# Patient Record
Sex: Female | Born: 1955
Health system: Southern US, Community
[De-identification: ages and names within clinical notes are randomized; demographics above are authoritative.]

## PROBLEM LIST (undated history)

## (undated) DIAGNOSIS — K5730 Diverticulosis of large intestine without perforation or abscess without bleeding: Secondary | ICD-10-CM

## (undated) DIAGNOSIS — M797 Fibromyalgia: Secondary | ICD-10-CM

## (undated) DIAGNOSIS — I10 Essential (primary) hypertension: Secondary | ICD-10-CM

## (undated) DIAGNOSIS — E785 Hyperlipidemia, unspecified: Secondary | ICD-10-CM

## (undated) DIAGNOSIS — M1990 Unspecified osteoarthritis, unspecified site: Secondary | ICD-10-CM

## (undated) HISTORY — DX: Diverticulosis of large intestine without perforation or abscess without bleeding: K57.30

## (undated) HISTORY — DX: Hyperlipidemia, unspecified: E78.5

## (undated) HISTORY — DX: Unspecified osteoarthritis, unspecified site: M19.90

## (undated) HISTORY — DX: Fibromyalgia: M79.7

## (undated) HISTORY — DX: Essential (primary) hypertension: I10

---

## 2006-11-14 ENCOUNTER — Ambulatory Visit: Payer: Self-pay | Admitting: Family Medicine

## 2006-11-17 ENCOUNTER — Ambulatory Visit: Payer: Self-pay | Admitting: *Deleted

## 2007-04-10 ENCOUNTER — Emergency Department (HOSPITAL_COMMUNITY): Admission: EM | Admit: 2007-04-10 | Discharge: 2007-04-10 | Payer: Self-pay | Admitting: Emergency Medicine

## 2007-04-28 ENCOUNTER — Ambulatory Visit: Payer: Self-pay | Admitting: Family Medicine

## 2007-07-29 ENCOUNTER — Encounter (INDEPENDENT_AMBULATORY_CARE_PROVIDER_SITE_OTHER): Payer: Self-pay | Admitting: *Deleted

## 2007-08-29 ENCOUNTER — Encounter: Payer: Self-pay | Admitting: Internal Medicine

## 2007-08-29 ENCOUNTER — Ambulatory Visit: Payer: Self-pay | Admitting: Internal Medicine

## 2007-08-31 ENCOUNTER — Encounter (INDEPENDENT_AMBULATORY_CARE_PROVIDER_SITE_OTHER): Payer: Self-pay | Admitting: Internal Medicine

## 2007-09-24 ENCOUNTER — Encounter: Admission: RE | Admit: 2007-09-24 | Discharge: 2007-10-21 | Payer: Self-pay | Admitting: Internal Medicine

## 2007-10-20 ENCOUNTER — Ambulatory Visit: Payer: Self-pay | Admitting: Family Medicine

## 2008-01-25 ENCOUNTER — Emergency Department (HOSPITAL_COMMUNITY): Admission: EM | Admit: 2008-01-25 | Discharge: 2008-01-25 | Payer: Self-pay | Admitting: Emergency Medicine

## 2008-04-26 ENCOUNTER — Encounter: Admission: RE | Admit: 2008-04-26 | Discharge: 2008-04-26 | Payer: Self-pay | Admitting: Internal Medicine

## 2010-02-06 ENCOUNTER — Ambulatory Visit: Payer: Self-pay | Admitting: Internal Medicine

## 2010-03-05 ENCOUNTER — Ambulatory Visit: Payer: Self-pay | Admitting: Family Medicine

## 2010-03-26 ENCOUNTER — Ambulatory Visit: Payer: Self-pay | Admitting: Internal Medicine

## 2010-03-26 LAB — CONVERTED CEMR LAB
AST: 18 units/L (ref 0–37)
Albumin: 3.8 g/dL (ref 3.5–5.2)
Alkaline Phosphatase: 99 units/L (ref 39–117)
BUN: 16 mg/dL (ref 6–23)
Basophils Absolute: 0.1 10*3/uL (ref 0.0–0.1)
CO2: 21 meq/L (ref 19–32)
Chloride: 107 meq/L (ref 96–112)
Creatinine, Ser: 0.75 mg/dL (ref 0.40–1.20)
Eosinophils Absolute: 0.2 10*3/uL (ref 0.0–0.7)
Glucose, Bld: 113 mg/dL — ABNORMAL HIGH (ref 70–99)
HDL: 55 mg/dL (ref >39)
Hemoglobin: 13.7 g/dL (ref 12.0–15.0)
LDL Cholesterol: 152 mg/dL — ABNORMAL HIGH (ref 0–99)
MCHC: 33.2 g/dL (ref 30.0–36.0)
MCV: 81.1 fL (ref 78.0–100.0)
Monocytes Absolute: 0.6 10*3/uL (ref 0.1–1.0)
Monocytes Relative: 4 % (ref 3–12)
Neutrophils Relative %: 68 % (ref 43–77)
Platelets: 291 10*3/uL (ref 150–400)
Potassium: 4.4 meq/L (ref 3.5–5.3)
RBC: 5.09 M/uL (ref 3.87–5.11)
Sodium: 141 meq/L (ref 135–145)
TSH: 0.802 microintl units/mL (ref 0.350–4.500)
Total Bilirubin: 0.5 mg/dL (ref 0.3–1.2)
Total CHOL/HDL Ratio: 4.2
Total Protein: 7.3 g/dL (ref 6.0–8.3)
VLDL: 25 mg/dL (ref 0–40)

## 2010-04-25 ENCOUNTER — Ambulatory Visit: Payer: Self-pay | Admitting: Internal Medicine

## 2010-04-26 ENCOUNTER — Ambulatory Visit: Payer: Self-pay | Admitting: Family Medicine

## 2010-04-26 LAB — CONVERTED CEMR LAB: Microalb, Ur: 0.5 mg/dL (ref 0.00–1.89)

## 2010-05-16 ENCOUNTER — Ambulatory Visit (HOSPITAL_COMMUNITY): Admission: RE | Admit: 2010-05-16 | Discharge: 2010-05-16 | Payer: Self-pay | Admitting: Internal Medicine

## 2010-05-31 ENCOUNTER — Encounter: Admission: RE | Admit: 2010-05-31 | Discharge: 2010-07-20 | Payer: Self-pay | Source: Home / Self Care

## 2010-06-15 ENCOUNTER — Encounter: Admission: RE | Admit: 2010-06-15 | Discharge: 2010-06-15 | Payer: Self-pay | Admitting: Family Medicine

## 2011-05-29 ENCOUNTER — Other Ambulatory Visit (HOSPITAL_COMMUNITY): Payer: Self-pay | Admitting: Family Medicine

## 2011-05-29 ENCOUNTER — Other Ambulatory Visit: Payer: Self-pay | Admitting: Family Medicine

## 2011-05-29 DIAGNOSIS — Z1231 Encounter for screening mammogram for malignant neoplasm of breast: Secondary | ICD-10-CM

## 2011-05-29 DIAGNOSIS — N951 Menopausal and female climacteric states: Secondary | ICD-10-CM

## 2011-06-04 ENCOUNTER — Encounter: Payer: Self-pay | Admitting: Gastroenterology

## 2011-06-12 ENCOUNTER — Ambulatory Visit (HOSPITAL_COMMUNITY)
Admission: RE | Admit: 2011-06-12 | Discharge: 2011-06-12 | Disposition: A | Discharge status: Home / Self Care | Payer: Self-pay | Source: Ambulatory Visit | Attending: Family Medicine | Admitting: Family Medicine

## 2011-06-12 DIAGNOSIS — Z1231 Encounter for screening mammogram for malignant neoplasm of breast: Secondary | ICD-10-CM | POA: Insufficient documentation

## 2011-06-12 DIAGNOSIS — N951 Menopausal and female climacteric states: Secondary | ICD-10-CM

## 2011-06-26 ENCOUNTER — Ambulatory Visit (AMBULATORY_SURGERY_CENTER): Payer: Self-pay | Admitting: *Deleted

## 2011-06-26 VITALS — Ht 67.0 in | Wt 263.7 lb

## 2011-06-26 DIAGNOSIS — K921 Melena: Secondary | ICD-10-CM

## 2011-06-26 MED ORDER — PEG 3350-KCL-NABCB-NACL-NASULF 236 G PO SOLR: ORAL | Status: AC

## 2011-07-10 ENCOUNTER — Ambulatory Visit (AMBULATORY_SURGERY_CENTER): Payer: Self-pay | Admitting: Gastroenterology

## 2011-07-10 ENCOUNTER — Encounter: Payer: Self-pay | Admitting: Gastroenterology

## 2011-07-10 VITALS — HR 75 | Temp 97.6°F | Resp 16 | Ht 67.0 in | Wt 260.0 lb

## 2011-07-10 DIAGNOSIS — K5730 Diverticulosis of large intestine without perforation or abscess without bleeding: Secondary | ICD-10-CM

## 2011-07-10 DIAGNOSIS — D126 Benign neoplasm of colon, unspecified: Secondary | ICD-10-CM

## 2011-07-10 DIAGNOSIS — K5010 Crohn's disease of large intestine without complications: Secondary | ICD-10-CM | POA: Insufficient documentation

## 2011-07-10 DIAGNOSIS — K921 Melena: Secondary | ICD-10-CM

## 2011-07-10 HISTORY — DX: Diverticulosis of large intestine without perforation or abscess without bleeding: K57.30

## 2011-07-10 MED ORDER — SODIUM CHLORIDE 0.9 % IV SOLN: 500.0000 mL | INTRAVENOUS | Status: DC

## 2011-07-10 NOTE — Patient Instructions (Signed)
Please refer to your blue and neon green sheets for instructions regarding diet and activity for the rest of today.  You may resume your medications as you would normally take them.   Diverticulosis Diverticulosis is a common condition that develops when small pouches (diverticula) form in the wall of the colon. The risk of diverticulosis increases with age. It happens more often in people who eat a low-fiber diet. Most individuals with diverticulosis have no symptoms. Those individuals with symptoms usually experience belly (abdominal) pain, constipation, or loose stools (diarrhea). HOME CARE INSTRUCTIONS  Increase the amount of fiber in your diet as directed by your caregiver or dietician. This may reduce symptoms of diverticulosis.   Your caregiver may recommend taking a dietary fiber supplement.   Drink at least 6 to 8 glasses of water each day to prevent constipation.   Try not to strain when you have a bowel movement.   Your caregiver may recommend avoiding nuts and seeds to prevent complications, although this is still an uncertain benefit.   Only take over-the-counter or prescription medicines for pain, discomfort, or fever as directed by your caregiver.  FOODS HAVING HIGH FIBER CONTENT INCLUDE:  Fruits. Apple, peach, pear, tangerine, raisins, prunes.   Vegetables. Brussels sprouts, asparagus, broccoli, cabbage, carrot, cauliflower, romaine lettuce, spinach, summer squash, tomato, winter squash, zucchini.   Starchy Vegetables. Baked beans, kidney beans, lima beans, split peas, lentils, potatoes (with skin).   Grains. Whole wheat bread, brown rice, bran flake cereal, plain oatmeal, white rice, shredded wheat, bran muffins.  SEEK IMMEDIATE MEDICAL CARE IF:  You develop increasing pain or severe bloating.   You have an oral temperature, not controlled by medicine.   You develop vomiting or bowel movements that are bloody or black.  Document Released: 07/25/2004 Document  Re-Released: 04/17/2010 [facility] Patient Information 2011 [place], [place].   High-Fiber Diet A high-fiber diet changes your normal diet to include more whole grains, legumes, fruits, and vegetables. Changes in the diet involve replacing refined carbohydrates with unrefined foods. The calorie level of the diet is essentially unchanged. The Dietary Reference Intake (recommended amount) for adult males is 38 grams per day. For adult females, it is 25 grams per day. Pregnant and lactating women should consume 28 grams of fiber per day. Fiber is the intact part of a plant that is not broken down during digestion. Functional fiber is fiber that has been isolated from the plant to provide a beneficial effect in the body. PURPOSE  Increase stool bulk.   Ease and regulate bowel movements.   Lower cholesterol.  INDICATIONS THAT YOU NEED MORE FIBER  Constipation and hemorrhoids.   Uncomplicated diverticulosis (intestine condition) and irritable bowel syndrome.   Weight management.   As a protective measure against hardening of the arteries (atherosclerosis), diabetes, and cancer.  NOTE OF CAUTION If you have a digestive or bowel problem, ask your caregiver for advice before adding high-fiber foods to your diet. Some of the following medical problems are such that a high-fiber diet should not be used without consulting your caregiver. DO NOT USE WITH:  Acute diverticulitis (intestine infection).   Partial small bowel obstructions.   Complicated diverticular disease involving bleeding, rupture (perforation), or abscess (boil, furuncle).   Presence of autonomic neuropathy (nerve damage) or gastric paresis (stomach cannot empty itself).  GUIDELINES FOR INCREASING FIBER IN THE DIET  Start adding fiber to the diet slowly. A gradual increase of about 5 more grams (2 slices of whole-wheat bread, 2 servings of most  fruits or vegetables, or 1 bowl of high-fiber cereal) per day is best. Too rapid an  increase in fiber may result in constipation, flatulence, and bloating.   Drink enough water and fluids to keep your urine clear or pale yellow. Water, juice, or caffeine-free drinks are recommended. Not drinking enough fluid may cause constipation.   Eat a variety of high-fiber foods rather than one type of fiber.   Try to increase your intake of fiber through using high-fiber foods rather than fiber pills or supplements that contain small amounts of fiber.   The goal is to change the types of food eaten. Do not supplement your present diet with high-fiber foods, but replace foods in your present diet.  INCLUDE A VARIETY OF FIBER SOURCES  Replace refined and processed grains with whole grains, canned fruits with fresh fruits, and incorporate other fiber sources. White rice, white breads, and most bakery goods contain little or no fiber.   Brown whole-grain rice, buckwheat oats, and many fruits and vegetables are all good sources of fiber. These include: broccoli, Brussels sprouts, cabbage, cauliflower, beets, sweet potatoes, white potatoes (skin on), carrots, tomatoes, eggplant, squash, berries, fresh fruits, and dried fruits.   Cereals appear to be the richest source of fiber. Cereal fiber is found in whole grains and bran. Bran is the fiber-rich outer coat of cereal grain, which is largely removed in refining. In whole-grain cereals, the bran remains. In breakfast cereals, the largest amount of fiber is found in those with "bran" in their names. The fiber content is sometimes indicated on the label.   You may need to include additional fruits and vegetables each day.   In baking, for 1 cup white flour, you may use the following substitutions:   1 cup whole-wheat flour minus 2 tablespoons.   1/2 cup white flour plus 1/2 cup whole-wheat flour.  References: Dietary Reference Intakes: Recommended Intakes for Individuals. [employer]. Institute of Medicine. Food and Nutrition  Board. Document Released: 10/28/2005 Document Re-Released: 01/22/2010 [facility] Patient Information 2011 [place], [place].   Polyps, Colon  A polyp is extra tissue that grows inside your body. Colon polyps grow in the large intestine. The large intestine, also called the colon, is part of your digestive system. It is a long, hollow tube at the end of your digestive tract where your body makes and stores stool. Most polyps are not dangerous. They are benign. This means they are not cancerous. But over time, some types of polyps can turn into cancer. Polyps that are smaller than a pea are usually not harmful. But larger polyps could someday become or may already be cancerous. To be safe, doctors remove all polyps and test them.  WHO GETS POLYPS? Anyone can get polyps, but certain people are more likely than others. You may have a greater chance of getting polyps if:  You are over 50.   You have had polyps before.   Someone in your family has had polyps.   Someone in your family has had cancer of the large intestine.   Find out if someone in your family has had polyps. You may also be more likely to get polyps if you:   Eat a lot of fatty foods   Smoke   Drink alcohol   Do not exercise  Eat too much  SYMPTOMS Most small polyps do not cause symptoms. People often do not know they have one until their caregiver finds it during a regular checkup or while testing  them for something else. Some people do have symptoms like these:  Bleeding from the anus. You might notice blood on your underwear or on toilet paper after you have had a bowel movement.   Constipation or diarrhea that lasts more than a week.   Blood in the stool. Blood can make stool look black or it can show up as red streaks in the stool.  If you have any of these symptoms, see your caregiver. HOW DOES THE DOCTOR TEST FOR POLYPS? The doctor can use four tests to check for polyps:  Digital rectal exam. The caregiver wears  gloves and checks your rectum (the last part of the large intestine) to see if it feels normal. This test would find polyps only in the rectum. Your caregiver may need to do one of the other tests listed below to find polyps higher up in the intestine.   Barium enema. The caregiver puts a liquid called barium into your rectum before taking x-rays of your large intestine. Barium makes your intestine look white in the pictures. Polyps are dark, so they are easy to see.   Sigmoidoscopy. With this test, the caregiver can see inside your large intestine. A thin flexible tube is placed into your rectum. The device is called a sigmoidoscope, which has a light and a tiny video camera in it. The caregiver uses the sigmoidoscope to look at the last third of your large intestine.   Colonoscopy. This test is like sigmoidoscopy, but the caregiver looks at all of the large intestine. It usually requires sedation. This is the most common method for finding and removing polyps.  TREATMENT  The caregiver will remove the polyp during sigmoidoscopy or colonoscopy. The polyp is then tested for cancer.   If you have had polyps, your caregiver may want you to get tested regularly in the future.  PREVENTION There is not one sure way to prevent polyps. You might be able to lower your risk of getting them if you:  Eat more fruits and vegetables and less fatty food.   Do not smoke.   Avoid alcohol.   Exercise every day.   Lose weight if you are overweight.   Eating more calcium and folate can also lower your risk of getting polyps. Some foods that are rich in calcium are milk, cheese, and broccoli. Some foods that are rich in folate are chickpeas, kidney beans, and spinach.   Aspirin might help prevent polyps. Studies are under way.  Document Released: 07/24/2004 Document Re-Released: 04/17/2010 [facility] Patient Information 2011 [place], [place].

## 2011-07-11 ENCOUNTER — Telehealth: Payer: Self-pay

## 2011-07-11 NOTE — Telephone Encounter (Signed)

## 2011-07-18 ENCOUNTER — Encounter: Payer: Self-pay | Admitting: Gastroenterology

## 2011-08-05 LAB — DIFFERENTIAL
Basophils Absolute: 0.1
Eosinophils Relative: 1
Lymphocytes Relative: 27
Neutro Abs: 12.9 — ABNORMAL HIGH

## 2011-08-05 LAB — I-STAT 8, (EC8 V) (CONVERTED LAB)
BUN: 13
Bicarbonate: 19.2 — ABNORMAL LOW
HCT: 47 — ABNORMAL HIGH
Operator id: 272551
pCO2, Ven: 24.1 — ABNORMAL LOW

## 2011-08-05 LAB — POCT I-STAT CREATININE: Creatinine, Ser: 0.9

## 2011-08-05 LAB — POCT CARDIAC MARKERS
CKMB, poc: <1 — ABNORMAL LOW
Myoglobin, poc: 29.9
Operator id: 272551
Troponin i, poc: <0.05

## 2011-08-05 LAB — CBC
Platelets: 294
RDW: 15.7 — ABNORMAL HIGH

## 2011-08-05 LAB — D-DIMER, QUANTITATIVE: D-Dimer, Quant: 0.23

## 2013-02-28 ENCOUNTER — Observation Stay (HOSPITAL_COMMUNITY)
Admission: EM | Admit: 2013-02-28 | Discharge: 2013-03-02 | Disposition: A | Discharge status: Home / Self Care | Payer: Self-pay | Attending: General Surgery | Admitting: General Surgery

## 2013-02-28 ENCOUNTER — Emergency Department (HOSPITAL_COMMUNITY): Payer: Self-pay

## 2013-02-28 ENCOUNTER — Encounter (HOSPITAL_COMMUNITY): Payer: Self-pay

## 2013-02-28 DIAGNOSIS — K8000 Calculus of gallbladder with acute cholecystitis without obstruction: Principal | ICD-10-CM | POA: Insufficient documentation

## 2013-02-28 DIAGNOSIS — R1011 Right upper quadrant pain: Secondary | ICD-10-CM | POA: Insufficient documentation

## 2013-02-28 DIAGNOSIS — I10 Essential (primary) hypertension: Secondary | ICD-10-CM | POA: Insufficient documentation

## 2013-02-28 DIAGNOSIS — E785 Hyperlipidemia, unspecified: Secondary | ICD-10-CM | POA: Insufficient documentation

## 2013-02-28 DIAGNOSIS — J449 Chronic obstructive pulmonary disease, unspecified: Secondary | ICD-10-CM | POA: Insufficient documentation

## 2013-02-28 DIAGNOSIS — J4489 Other specified chronic obstructive pulmonary disease: Secondary | ICD-10-CM | POA: Insufficient documentation

## 2013-02-28 DIAGNOSIS — K810 Acute cholecystitis: Secondary | ICD-10-CM

## 2013-02-28 LAB — CBC WITH DIFFERENTIAL/PLATELET
Eosinophils Absolute: 0.1 10*3/uL (ref 0.0–0.7)
Eosinophils Relative: 1 % (ref 0–5)
HCT: 40.7 % (ref 36.0–46.0)
Hemoglobin: 13.9 g/dL (ref 12.0–15.0)
Lymphs Abs: 4.6 10*3/uL — ABNORMAL HIGH (ref 0.7–4.0)
MCH: 25.9 pg — ABNORMAL LOW (ref 26.0–34.0)
MCV: 75.9 fL — ABNORMAL LOW (ref 78.0–100.0)
Monocytes Absolute: 1.2 10*3/uL — ABNORMAL HIGH (ref 0.1–1.0)
Monocytes Relative: 5 % (ref 3–12)
Platelets: 339 10*3/uL (ref 150–400)
RBC: 5.36 MIL/uL — ABNORMAL HIGH (ref 3.87–5.11)

## 2013-02-28 LAB — COMPREHENSIVE METABOLIC PANEL
BUN: 16 mg/dL (ref 6–23)
Calcium: 10 mg/dL (ref 8.4–10.5)
Creatinine, Ser: 0.83 mg/dL (ref 0.50–1.10)
GFR calc Af Amer: 89 mL/min — ABNORMAL LOW (ref >90)
Glucose, Bld: 110 mg/dL — ABNORMAL HIGH (ref 70–99)
Total Protein: 8.6 g/dL — ABNORMAL HIGH (ref 6.0–8.3)

## 2013-02-28 LAB — LIPASE, BLOOD: Lipase: 61 U/L — ABNORMAL HIGH (ref 11–59)

## 2013-02-28 MED ORDER — MORPHINE SULFATE 4 MG/ML IJ SOLN
4.0000 mg | INTRAMUSCULAR | Status: DC | PRN
  Administered 2013-03-01 (×3): 4 mg via INTRAVENOUS
  Filled 2013-02-28 (×3): qty 1

## 2013-02-28 MED ORDER — ONDANSETRON HCL 4 MG/2ML IJ SOLN
4.0000 mg | Freq: Once | INTRAMUSCULAR | Status: AC
  Administered 2013-02-28: 4 mg via INTRAVENOUS
  Filled 2013-02-28: qty 2

## 2013-02-28 MED ORDER — MORPHINE SULFATE 4 MG/ML IJ SOLN
4.0000 mg | Freq: Once | INTRAMUSCULAR | Status: AC
  Administered 2013-02-28: 4 mg via INTRAVENOUS
  Filled 2013-02-28: qty 1

## 2013-02-28 NOTE — ED Notes (Signed)
Patient from home via EMS. Ambulatory into ED. Complains severe RUQ pain for "a while." Episodic in nature, occurs in waves. No nausea/vomitting. Standing makes it better.

## 2013-02-28 NOTE — ED Notes (Signed)
Patient transported to Ultrasound 

## 2013-02-28 NOTE — ED Notes (Signed)
Patient upset and crying because an ED provider has not seen her yet. Patient has been here for over 2 hours. Attempted to explain delay to patient. Refuses to provide urine specimen until seen by a ED provider. Informed resident and EDP.

## 2013-03-01 ENCOUNTER — Encounter (HOSPITAL_COMMUNITY): Payer: Self-pay | Admitting: *Deleted

## 2013-03-01 ENCOUNTER — Observation Stay (HOSPITAL_COMMUNITY): Payer: Self-pay | Admitting: Anesthesiology

## 2013-03-01 ENCOUNTER — Encounter (HOSPITAL_COMMUNITY): Payer: Self-pay | Admitting: Anesthesiology

## 2013-03-01 ENCOUNTER — Encounter (HOSPITAL_COMMUNITY)
Admission: EM | Disposition: A | Discharge status: Home / Self Care | Payer: Self-pay | Source: Home / Self Care | Attending: Emergency Medicine

## 2013-03-01 DIAGNOSIS — K8010 Calculus of gallbladder with chronic cholecystitis without obstruction: Secondary | ICD-10-CM

## 2013-03-01 HISTORY — PX: CHOLECYSTECTOMY: SHX55

## 2013-03-01 LAB — URINALYSIS, MICROSCOPIC ONLY
Nitrite: NEGATIVE
Protein, ur: NEGATIVE mg/dL
Specific Gravity, Urine: 1.022 (ref 1.005–1.030)
Urobilinogen, UA: 0.2 mg/dL (ref 0.0–1.0)

## 2013-03-01 LAB — URINALYSIS, DIPSTICK ONLY
Bilirubin Urine: NEGATIVE
Glucose, UA: NEGATIVE mg/dL
Hgb urine dipstick: NEGATIVE
Nitrite: NEGATIVE
Specific Gravity, Urine: 1.022 (ref 1.005–1.030)
pH: 5.5 (ref 5.0–8.0)

## 2013-03-01 LAB — SURGICAL PCR SCREEN: MRSA, PCR: NEGATIVE

## 2013-03-01 LAB — CBC
Hemoglobin: 12.6 g/dL (ref 12.0–15.0)
MCH: 25.9 pg — ABNORMAL LOW (ref 26.0–34.0)
MCHC: 33.3 g/dL (ref 30.0–36.0)

## 2013-03-01 LAB — COMPREHENSIVE METABOLIC PANEL
Albumin: 3.2 g/dL — ABNORMAL LOW (ref 3.5–5.2)
Alkaline Phosphatase: 127 U/L — ABNORMAL HIGH (ref 39–117)
BUN: 17 mg/dL (ref 6–23)
Chloride: 101 mEq/L (ref 96–112)
Glucose, Bld: 132 mg/dL — ABNORMAL HIGH (ref 70–99)
Potassium: 3.8 mEq/L (ref 3.5–5.1)
Total Bilirubin: 0.5 mg/dL (ref 0.3–1.2)

## 2013-03-01 LAB — MAGNESIUM: Magnesium: 2 mg/dL (ref 1.5–2.5)

## 2013-03-01 SURGERY — LAPAROSCOPIC CHOLECYSTECTOMY
Anesthesia: General | Site: Abdomen | Wound class: Clean Contaminated

## 2013-03-01 MED ORDER — PANTOPRAZOLE SODIUM 40 MG IV SOLR
40.0000 mg | Freq: Every day | INTRAVENOUS | Status: DC
  Administered 2013-03-01: 40 mg via INTRAVENOUS
  Filled 2013-03-01 (×3): qty 40

## 2013-03-01 MED ORDER — HYDROCODONE-ACETAMINOPHEN 5-325 MG PO TABS
ORAL_TABLET | ORAL | Status: AC
  Administered 2013-03-01: 2 via ORAL
  Filled 2013-03-01: qty 2

## 2013-03-01 MED ORDER — KCL IN DEXTROSE-NACL 20-5-0.45 MEQ/L-%-% IV SOLN
INTRAVENOUS | Status: DC
  Administered 2013-03-01 – 2013-03-02 (×3): via INTRAVENOUS
  Filled 2013-03-01 (×5): qty 1000

## 2013-03-01 MED ORDER — DIPHENHYDRAMINE HCL 50 MG/ML IJ SOLN: 12.5000 mg | Freq: Four times a day (QID) | INTRAMUSCULAR | Status: DC | PRN

## 2013-03-01 MED ORDER — DEXAMETHASONE SODIUM PHOSPHATE 4 MG/ML IJ SOLN
INTRAMUSCULAR | Status: DC | PRN
  Administered 2013-03-01: 8 mg via INTRAVENOUS

## 2013-03-01 MED ORDER — LISINOPRIL-HYDROCHLOROTHIAZIDE 20-12.5 MG PO TABS: 1.0000 | ORAL_TABLET | Freq: Every day | ORAL | Status: DC

## 2013-03-01 MED ORDER — ONDANSETRON HCL 4 MG/2ML IJ SOLN
INTRAMUSCULAR | Status: DC | PRN
  Administered 2013-03-01: 4 mg via INTRAVENOUS

## 2013-03-01 MED ORDER — OXYCODONE HCL 5 MG PO TABS: 5.0000 mg | ORAL_TABLET | Freq: Once | ORAL | Status: DC | PRN

## 2013-03-01 MED ORDER — BUPIVACAINE-EPINEPHRINE 0.25% -1:200000 IJ SOLN
INTRAMUSCULAR | Status: AC
  Filled 2013-03-01: qty 1

## 2013-03-01 MED ORDER — OXYCODONE HCL 5 MG/5ML PO SOLN: 5.0000 mg | Freq: Once | ORAL | Status: DC | PRN

## 2013-03-01 MED ORDER — ONDANSETRON HCL 4 MG/2ML IJ SOLN: 4.0000 mg | Freq: Four times a day (QID) | INTRAMUSCULAR | Status: DC | PRN

## 2013-03-01 MED ORDER — PROPOFOL 10 MG/ML IV BOLUS
INTRAVENOUS | Status: DC | PRN
  Administered 2013-03-01: 150 mg via INTRAVENOUS
  Administered 2013-03-01: 50 mg via INTRAVENOUS

## 2013-03-01 MED ORDER — ACETAMINOPHEN 650 MG RE SUPP: 650.0000 mg | Freq: Four times a day (QID) | RECTAL | Status: DC | PRN

## 2013-03-01 MED ORDER — HYDROCODONE-ACETAMINOPHEN 5-325 MG PO TABS
1.0000 | ORAL_TABLET | ORAL | Status: DC | PRN
  Administered 2013-03-01 – 2013-03-02 (×3): 2 via ORAL
  Filled 2013-03-01 (×3): qty 2

## 2013-03-01 MED ORDER — HYDROMORPHONE HCL PF 1 MG/ML IJ SOLN: 0.2500 mg | INTRAMUSCULAR | Status: DC | PRN

## 2013-03-01 MED ORDER — SODIUM CHLORIDE 0.9 % IR SOLN
Status: DC | PRN
  Administered 2013-03-01: 1000 mL

## 2013-03-01 MED ORDER — ACETAMINOPHEN 325 MG PO TABS
650.0000 mg | ORAL_TABLET | Freq: Four times a day (QID) | ORAL | Status: DC | PRN
  Administered 2013-03-01: 650 mg via ORAL
  Filled 2013-03-01: qty 2

## 2013-03-01 MED ORDER — FENTANYL CITRATE 0.05 MG/ML IJ SOLN
INTRAMUSCULAR | Status: DC | PRN
  Administered 2013-03-01 (×2): 50 ug via INTRAVENOUS
  Administered 2013-03-01: 100 ug via INTRAVENOUS

## 2013-03-01 MED ORDER — ARTIFICIAL TEARS OP OINT
TOPICAL_OINTMENT | OPHTHALMIC | Status: DC | PRN
  Administered 2013-03-01: 1 via OPHTHALMIC

## 2013-03-01 MED ORDER — MIDAZOLAM HCL 5 MG/5ML IJ SOLN
INTRAMUSCULAR | Status: DC | PRN
  Administered 2013-03-01: 2 mg via INTRAVENOUS

## 2013-03-01 MED ORDER — ROCURONIUM BROMIDE 100 MG/10ML IV SOLN
INTRAVENOUS | Status: DC | PRN
  Administered 2013-03-01: 50 mg via INTRAVENOUS

## 2013-03-01 MED ORDER — HYDROCHLOROTHIAZIDE 12.5 MG PO CAPS
12.5000 mg | ORAL_CAPSULE | Freq: Every day | ORAL | Status: DC
  Administered 2013-03-02: 12.5 mg via ORAL
  Filled 2013-03-01: qty 1

## 2013-03-01 MED ORDER — DIPHENHYDRAMINE HCL 12.5 MG/5ML PO ELIX: 12.5000 mg | ORAL_SOLUTION | Freq: Four times a day (QID) | ORAL | Status: DC | PRN

## 2013-03-01 MED ORDER — CYCLOBENZAPRINE HCL 10 MG PO TABS: 10.0000 mg | ORAL_TABLET | Freq: Three times a day (TID) | ORAL | Status: DC | PRN

## 2013-03-01 MED ORDER — NEOSTIGMINE METHYLSULFATE 1 MG/ML IJ SOLN
INTRAMUSCULAR | Status: DC | PRN
  Administered 2013-03-01: 3 mg via INTRAVENOUS

## 2013-03-01 MED ORDER — LIDOCAINE HCL (CARDIAC) 20 MG/ML IV SOLN
INTRAVENOUS | Status: DC | PRN
  Administered 2013-03-01: 100 mg via INTRAVENOUS

## 2013-03-01 MED ORDER — PROMETHAZINE HCL 25 MG/ML IJ SOLN: 6.2500 mg | INTRAMUSCULAR | Status: DC | PRN

## 2013-03-01 MED ORDER — BUPIVACAINE-EPINEPHRINE 0.25% -1:200000 IJ SOLN
INTRAMUSCULAR | Status: DC | PRN
  Administered 2013-03-01: 17 mL

## 2013-03-01 MED ORDER — AMPICILLIN-SULBACTAM SODIUM 3 (2-1) G IJ SOLR
3.0000 g | Freq: Four times a day (QID) | INTRAMUSCULAR | Status: DC
  Administered 2013-03-01 (×2): 3 g via INTRAVENOUS
  Filled 2013-03-01 (×3): qty 3

## 2013-03-01 MED ORDER — SENNA 8.6 MG PO TABS
1.0000 | ORAL_TABLET | Freq: Two times a day (BID) | ORAL | Status: DC
  Administered 2013-03-01 – 2013-03-02 (×2): 8.6 mg via ORAL
  Filled 2013-03-01 (×3): qty 1

## 2013-03-01 MED ORDER — LISINOPRIL 20 MG PO TABS
20.0000 mg | ORAL_TABLET | Freq: Every day | ORAL | Status: DC
  Administered 2013-03-02: 20 mg via ORAL
  Filled 2013-03-01: qty 1

## 2013-03-01 MED ORDER — SODIUM CHLORIDE 0.9 % IV SOLN
INTRAVENOUS | Status: DC | PRN
  Administered 2013-03-01: 10:00:00

## 2013-03-01 MED ORDER — MORPHINE SULFATE 2 MG/ML IJ SOLN
1.0000 mg | INTRAMUSCULAR | Status: DC | PRN
  Administered 2013-03-01 (×2): 2 mg via INTRAVENOUS
  Filled 2013-03-01 (×2): qty 1

## 2013-03-01 MED ORDER — LACTATED RINGERS IV SOLN
INTRAVENOUS | Status: DC | PRN
  Administered 2013-03-01 (×2): via INTRAVENOUS

## 2013-03-01 MED ORDER — GLYCOPYRROLATE 0.2 MG/ML IJ SOLN
INTRAMUSCULAR | Status: DC | PRN
  Administered 2013-03-01: 0.4 mg via INTRAVENOUS

## 2013-03-01 MED ORDER — DOCUSATE SODIUM 100 MG PO CAPS
100.0000 mg | ORAL_CAPSULE | Freq: Two times a day (BID) | ORAL | Status: DC
  Administered 2013-03-01 – 2013-03-02 (×2): 100 mg via ORAL
  Filled 2013-03-01 (×2): qty 1

## 2013-03-01 SURGICAL SUPPLY — 48 items
APPLIER CLIP 5 13 M/L LIGAMAX5 (MISCELLANEOUS) ×3
APPLIER CLIP ROT 10 11.4 M/L (STAPLE)
BLADE SURG ROTATE 9660 (MISCELLANEOUS) IMPLANT
CANISTER SUCTION 2500CC (MISCELLANEOUS) ×3 IMPLANT
CHLORAPREP W/TINT 26ML (MISCELLANEOUS) ×3 IMPLANT
CLIP APPLIE 5 13 M/L LIGAMAX5 (MISCELLANEOUS) ×2 IMPLANT
CLIP APPLIE ROT 10 11.4 M/L (STAPLE) IMPLANT
CLOTH BEACON ORANGE TIMEOUT ST (SAFETY) ×3 IMPLANT
CLSR STERI-STRIP ANTIMIC 1/2X4 (GAUZE/BANDAGES/DRESSINGS) ×3 IMPLANT
COVER MAYO STAND STRL (DRAPES) ×3 IMPLANT
COVER SURGICAL LIGHT HANDLE (MISCELLANEOUS) ×3 IMPLANT
DECANTER SPIKE VIAL GLASS SM (MISCELLANEOUS) ×3 IMPLANT
DERMABOND ADVANCED (GAUZE/BANDAGES/DRESSINGS) ×1
DERMABOND ADVANCED .7 DNX12 (GAUZE/BANDAGES/DRESSINGS) ×2 IMPLANT
DRAPE C-ARM 42X72 X-RAY (DRAPES) ×3 IMPLANT
DRAPE UTILITY 15X26 W/TAPE STR (DRAPE) ×6 IMPLANT
ELECT REM PT RETURN 9FT ADLT (ELECTROSURGICAL) ×3
ELECTRODE REM PT RTRN 9FT ADLT (ELECTROSURGICAL) ×2 IMPLANT
GLOVE BIOGEL PI IND STRL 6.5 (GLOVE) ×2 IMPLANT
GLOVE BIOGEL PI IND STRL 7.0 (GLOVE) ×2 IMPLANT
GLOVE BIOGEL PI IND STRL 8 (GLOVE) ×2 IMPLANT
GLOVE BIOGEL PI INDICATOR 6.5 (GLOVE) ×1
GLOVE BIOGEL PI INDICATOR 7.0 (GLOVE) ×1
GLOVE BIOGEL PI INDICATOR 8 (GLOVE) ×1
GLOVE ECLIPSE 7.5 STRL STRAW (GLOVE) ×3 IMPLANT
GLOVE SS BIOGEL STRL SZ 6.5 (GLOVE) ×2 IMPLANT
GLOVE SS BIOGEL STRL SZ 7 (GLOVE) ×2 IMPLANT
GLOVE SUPERSENSE BIOGEL SZ 6.5 (GLOVE) ×1
GLOVE SUPERSENSE BIOGEL SZ 7 (GLOVE) ×1
GOWN STRL NON-REIN LRG LVL3 (GOWN DISPOSABLE) ×6 IMPLANT
KIT BASIN OR (CUSTOM PROCEDURE TRAY) ×3 IMPLANT
KIT ROOM TURNOVER OR (KITS) ×3 IMPLANT
NS IRRIG 1000ML POUR BTL (IV SOLUTION) ×3 IMPLANT
PAD ARMBOARD 7.5X6 YLW CONV (MISCELLANEOUS) ×6 IMPLANT
POUCH SPECIMEN RETRIEVAL 10MM (ENDOMECHANICALS) IMPLANT
SCISSORS LAP 5X35 DISP (ENDOMECHANICALS) IMPLANT
SET CHOLANGIOGRAPH 5 50 .035 (SET/KITS/TRAYS/PACK) ×3 IMPLANT
SET IRRIG TUBING LAPAROSCOPIC (IRRIGATION / IRRIGATOR) ×3 IMPLANT
SLEEVE ENDOPATH XCEL 5M (ENDOMECHANICALS) ×6 IMPLANT
SPECIMEN JAR SMALL (MISCELLANEOUS) ×3 IMPLANT
SUT MNCRL AB 4-0 PS2 18 (SUTURE) ×3 IMPLANT
TOWEL OR 17X24 6PK STRL BLUE (TOWEL DISPOSABLE) ×3 IMPLANT
TOWEL OR 17X26 10 PK STRL BLUE (TOWEL DISPOSABLE) ×3 IMPLANT
TRAY LAPAROSCOPIC (CUSTOM PROCEDURE TRAY) ×3 IMPLANT
TROCAR XCEL BLUNT TIP 100MML (ENDOMECHANICALS) ×3 IMPLANT
TROCAR XCEL NON-BLD 11X100MML (ENDOMECHANICALS) IMPLANT
TROCAR XCEL NON-BLD 5MMX100MML (ENDOMECHANICALS) ×3 IMPLANT
WATER STERILE IRR 1000ML POUR (IV SOLUTION) IMPLANT

## 2013-03-01 NOTE — Progress Notes (Addendum)
The patient has been NPO since midnight.  Still has some RUQ tenderness, minimal pain.  For laparoscopic cholecystectomy this morning.  Marta Lamas. Gae Bon, MD, FACS [phone] Surgery  The patient's WBC is still > 20K even though she has been on IV antibiotics.  She had a sonographic Murphy's sign on [place].  May have a gangrenous GB or some other process may be going on.  Will proceed with surgery.  Marta Lamas. Gae Bon, MD, FACS [phone] [facility] Surgery

## 2013-03-01 NOTE — ED Notes (Signed)
Surgery at bedside.

## 2013-03-01 NOTE — Anesthesia Preprocedure Evaluation (Addendum)
Anesthesia Evaluation  Patient identified by MRN, date of birth, ID band Patient awake    Reviewed: Allergy & Precautions, H&P , NPO status , Patient's Chart, lab work & pertinent test results  Airway Mallampati: II TM Distance: >3 FB Neck ROM: Full    Dental  (+) Edentulous Upper and Missing   Pulmonary COPDCurrent Smoker,  + rhonchi         Cardiovascular hypertension, Pt. on medications Rhythm:Regular Rate:Normal     Neuro/Psych    GI/Hepatic negative GI ROS, Neg liver ROS, 03-01-13 US Abdomen Abdominal aorta:  Inadequately visualized.  No aneurysm where seen   IMPRESSION: Technically challenging examination due to limited acoustic windows, bowel gas artifact, and shadowing from gallstones.   Gallstones with a positive sonographic Murphy's sign raises concern for acute cholecystitis.   Hepatic steatosis.   CBD, pancreas, and aorta inadequately visualized.        Endo/Other  Morbid obesity  Renal/GU      Musculoskeletal  (+) Fibromyalgia -  Abdominal (+) + obese,   Peds  Hematology   Anesthesia Other Findings 4 Teeth remain on bottom only  Reproductive/Obstetrics                        Anesthesia Physical Anesthesia Plan  ASA: III  Anesthesia Plan: General   Post-op Pain Management:    Induction: Intravenous  Airway Management Planned: Oral ETT  Additional Equipment:   Intra-op Plan:   Post-operative Plan: Extubation in OR  Informed Consent: I have reviewed the patients History and Physical, chart, labs and discussed the procedure including the risks, benefits and alternatives for the proposed anesthesia with the patient or authorized representative who has indicated his/her understanding and acceptance.     Plan Discussed with: CRNA and Surgeon  Anesthesia Plan Comments:         Anesthesia Quick Evaluation

## 2013-03-01 NOTE — Transfer of Care (Signed)
Immediate Anesthesia Transfer of Care Note  Patient: Mariah Martinez  Procedure(s) Performed: Procedure(s): LAPAROSCOPIC CHOLECYSTECTOMY (N/A)  Patient Location: PACU  Anesthesia Type:General  Level of Consciousness: awake, alert , oriented and patient cooperative  Airway & Oxygen Therapy: Patient Spontanous Breathing and Patient connected to nasal cannula oxygen  Post-op Assessment: Report given to PACU RN and Post -op Vital signs reviewed and stable  Post vital signs: Reviewed and stable  Complications: No apparent anesthesia complications

## 2013-03-01 NOTE — H&P (Addendum)
Mariah Martinez is an 57 y.o. female.   Chief Complaint: abdominal pain HPI:  Pt is s 57 yo F with pain for around 18 hours today that has been constant in the upper abdomen.  She has had attacks of pain that are similar over the past few months.  The other ones have resolved spontaneously with rest.  She does think the severe attacks of pain are related to cream/other fried food.  She typically has nausea with the other attacks as well.    Past Medical History  Diagnosis Date  . Hypertension   . Hyperlipidemia   . Arthritis   . Fibromyalgia     History reviewed. No pertinent past surgical history.  Family History  Problem Relation Age of Onset  . Colon cancer Neg Hx   . Colon polyps Neg Hx   . Stomach cancer Neg Hx    Social History:  reports that she has been smoking Cigarettes.  She has a 16 pack-year smoking history. She has never used smokeless tobacco. She reports that she drinks about 0.6 ounces of alcohol per week. She reports that she does not use illicit drugs.  Allergies: No Known Allergies  MedicationsLong-Term  Prescriptions Show Facility-Administered Medications   aspirin 81 MG tablet  cholecalciferol (VITAMIN D) 1000 UNITS tablet  cyclobenzaprine (FLEXERIL) 10 MG tablet  ibuprofen (ADVIL,MOTRIN) 800 MG tablet  lisinopril-hydrochlorothiazide (PRINZIDE,ZESTORETIC) 20-12.5 MG per tablet  vitamin E 600 UNIT capsule     Results for orders placed during the hospital encounter of 02/28/13 (from the past 48 hour(s))  CBC WITH DIFFERENTIAL     Status: Abnormal   Collection Time    02/28/13  9:29 PM      Result Value Range   WBC 22.6 (*) 4.0 - 10.5 K/uL   RBC 5.36 (*) 3.87 - 5.11 MIL/uL   Hemoglobin 13.9  12.0 - 15.0 g/dL   HCT 16.1  09.6 - 04.5 %   MCV 75.9 (*) 78.0 - 100.0 fL   MCH 25.9 (*) 26.0 - 34.0 pg   MCHC 34.2  30.0 - 36.0 g/dL   RDW 40.9 (*) 81.1 - 91.4 %   Platelets 339  150 - 400 K/uL   Neutrophils Relative 74  43 - 77 %   Neutro Abs 16.6 (*)  1.7 - 7.7 K/uL   Lymphocytes Relative 20  12 - 46 %   Lymphs Abs 4.6 (*) 0.7 - 4.0 K/uL   Monocytes Relative 5  3 - 12 %   Monocytes Absolute 1.2 (*) 0.1 - 1.0 K/uL   Eosinophils Relative 1  0 - 5 %   Eosinophils Absolute 0.1  0.0 - 0.7 K/uL   Basophils Relative 0  0 - 1 %   Basophils Absolute 0.0  0.0 - 0.1 K/uL  COMPREHENSIVE METABOLIC PANEL     Status: Abnormal   Collection Time    02/28/13  9:29 PM      Result Value Range   Sodium 131 (*) 135 - 145 mEq/L   Potassium 4.4  3.5 - 5.1 mEq/L   Comment: HEMOLYSIS AT THIS LEVEL MAY AFFECT RESULT   Chloride 96  96 - 112 mEq/L   CO2 23  19 - 32 mEq/L   Glucose, Bld 110 (*) 70 - 99 mg/dL   BUN 16  6 - 23 mg/dL   Creatinine, Ser 7.82  0.50 - 1.10 mg/dL   Calcium 95.6  8.4 - 21.3 mg/dL   Total Protein 8.6 (*) 6.0 - 8.3  g/dL   Albumin 3.4 (*) 3.5 - 5.2 g/dL   AST 30  0 - 37 U/L   ALT 18  0 - 35 U/L   Alkaline Phosphatase 137 (*) 39 - 117 U/L   Total Bilirubin 0.4  0.3 - 1.2 mg/dL   GFR calc non Af Amer 77 (*) >90 mL/min   GFR calc Af Amer 89 (*) >90 mL/min   Comment:            The eGFR has been calculated     using the CKD EPI equation.     This calculation has not been     validated in all clinical     situations.     eGFR's persistently     <90 mL/min signify     possible Chronic Kidney Disease.  LIPASE, BLOOD     Status: Abnormal   Collection Time    02/28/13  9:29 PM      Result Value Range   Lipase 61 (*) 11 - 59 U/L  URINALYSIS, MICROSCOPIC ONLY     Status: Abnormal   Collection Time    03/01/13 12:46 AM      Result Value Range   Color, Urine YELLOW  YELLOW   APPearance CLEAR  CLEAR   Specific Gravity, Urine 1.022  1.005 - 1.030   pH 5.0  5.0 - 8.0   Glucose, UA NEGATIVE  NEGATIVE mg/dL   Hgb urine dipstick NEGATIVE  NEGATIVE   Bilirubin Urine NEGATIVE  NEGATIVE   Ketones, ur NEGATIVE  NEGATIVE mg/dL   Protein, ur NEGATIVE  NEGATIVE mg/dL   Urobilinogen, UA 0.2  0.0 - 1.0 mg/dL   Nitrite NEGATIVE  NEGATIVE    Leukocytes, UA NEGATIVE  NEGATIVE   Bacteria, UA RARE  RARE   Squamous Epithelial / LPF FEW (*) RARE   Casts GRANULAR CAST (*) NEGATIVE   Comment: HYALINE CASTS   US Abdomen Complete  03/01/2013  *RADIOLOGY REPORT*  Clinical Data:  Right upper quadrant abdominal pain.  COMPLETE ABDOMINAL ULTRASOUND  Comparison:  None.  Findings:  Gallbladder:  Gallstones.  Limited evaluation for wall thickening given the shadowing stones.  No pericholecystic fluid visualized. Positive Murphy's sign per the sonographer.  Common bile duct:  Poorly visualized due to the shadowing gallbladder stones.  The proximal portion measures 5 mm.  Liver:  Diffusely increased in echogenicity.  Focal lesion detection is limited in this setting.  IVC:  Poorly visualized  Pancreas:  Poorly visualized throughout due to bowel gas artifact/limited acoustic windows.  Spleen:  Measures 8 cm.  No focal abnormality.  Right Kidney:  Measures 12.4 cm.  No hydronephrosis or focal abnormality.  Left Kidney:  Measures 11.5 cm.  No hydronephrosis or focal abnormality.  Abdominal aorta:  Inadequately visualized.  No aneurysm where seen  IMPRESSION: Technically challenging examination due to limited acoustic windows, bowel gas artifact, and shadowing from gallstones.  Gallstones with a positive sonographic Murphy's sign raises concern for acute cholecystitis.  Hepatic steatosis.  CBD, pancreas, and aorta inadequately visualized.   Original Report Authenticated By: Jearld Lesch, M.D.     Review of Systems  Constitutional: Negative.   HENT: Negative.   Eyes: Negative.   Respiratory: Negative.   Cardiovascular: Negative.   Gastrointestinal: Positive for nausea.  Genitourinary: Negative.   Musculoskeletal: Negative.   Skin: Negative.   Neurological: Negative.   Endo/Heme/Allergies: Negative.   Psychiatric/Behavioral: Negative.     Blood pressure 107/66, pulse 99, temperature 98.3 F (36.8  C), temperature source Oral, resp. rate 18, SpO2  96.00%. Physical Exam  Constitutional: She is oriented to person, place, and time. She appears well-developed and well-nourished. She appears distressed.  HENT:  Head: Normocephalic and atraumatic.  Eyes: Conjunctivae are normal. Pupils are equal, round, and reactive to light. No scleral icterus.  Neck: Normal range of motion. Neck supple. No thyromegaly present.  Cardiovascular: Normal rate, regular rhythm, normal heart sounds and intact distal pulses.  Exam reveals no gallop and no friction rub.   No murmur heard. Respiratory: Effort normal and breath sounds normal.  GI: Soft. Bowel sounds are normal. She exhibits distension. She exhibits no mass. There is tenderness (RUQ/epigastric). There is no rebound and no guarding.  Musculoskeletal: Normal range of motion. She exhibits no edema and no tenderness.  Lymphadenopathy:    She has no cervical adenopathy.  Neurological: She is alert and oriented to person, place, and time. Coordination normal.  Skin: Skin is dry. No rash noted. She is not diaphoretic. No erythema. No pallor.  Psychiatric: She has a normal mood and affect. Her behavior is normal. Judgment and thought content normal.     Assessment/Plan Acute cholecystitis.  Plan lap chole later today with Dr. Lindie Spruce, time permitting. IVF IV antibiotics NPO  The surgical procedure was described to the patient in detail.  The patient was given Agricultural engineer. .  I discussed the incision type and location, the location of the gallbladder, the anatomy of the bile ducts and arteries, and the typical progression of surgery.  I discussed the possibility of converting to an open operation.  I advised of the risks of bleeding, infection, damage to other structures (such as the bile duct, intestine or liver), bile leak, need for other procedures or surgeries, and post op diarrhea/constipation.  We discussed the risk of blood clot.  We discussed the recovery period and post operative  restrictions.  The patient was advised against taking blood thinners the week before surgery.   HTN - treat with home meds.   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  She is very concerned about her surgery. She has no insurance.  She would like to speak to case management.     Janis Sol 03/01/2013, 2:56 AM

## 2013-03-01 NOTE — Preoperative (Signed)
Beta Blockers   Reason not to administer Beta Blockers:Not Applicable 

## 2013-03-01 NOTE — Anesthesia Postprocedure Evaluation (Signed)
  Anesthesia Post-op Note  Patient: Mariah Martinez  Procedure(s) Performed: Procedure(s): LAPAROSCOPIC CHOLECYSTECTOMY (N/A)  Patient Location: PACU  Anesthesia Type:General  Level of Consciousness: awake and alert   Airway and Oxygen Therapy: Patient Spontanous Breathing  Post-op Pain: mild  Post-op Assessment: Post-op Vital signs reviewed, Patient's Cardiovascular Status Stable, Respiratory Function Stable, Patent Airway, No signs of Nausea or vomiting and Pain level controlled  Post-op Vital Signs: stable  Complications: No apparent anesthesia complications

## 2013-03-01 NOTE — Op Note (Signed)
OPERATIVE REPORT  DATE OF OPERATION: 02/28/2013 - 03/01/2013  PATIENT:  Mariah Martinez  57 y.o. female  PRE-OPERATIVE DIAGNOSIS:  acute cholecysititis  POST-OPERATIVE DIAGNOSIS:  acute cholecysititis  PROCEDURE:  Procedure(s): LAPAROSCOPIC CHOLECYSTECTOMY  SURGEON:  Surgeon(s): Cherylynn Ridges, MD  ASSISTANT: None  ANESTHESIA:   general  EBL: <20 ml  BLOOD ADMINISTERED: none  DRAINS: none   SPECIMEN:  Source of Specimen:  Gallbladder and stones  COUNTS CORRECT:  YES  PROCEDURE DETAILS: The patient was taken to the operating room and placed on the table in the supine position.  After an adequate endotracheal anesthetic was administered, (she/he) was prepped with ChloroPrep, and then draped in the usual manner exposing the entire abdomen laterally, inferiorly and up  to the costal margins.  After a proper timeout was performed including identifying the patient and the procedure to be performed, a supra-umbilical 1.5cm midline incision was made using a #15 blade.  This was taken down to the fascia which was then incised with a #15 blade.  The edges of the fascia were tented up with Kocher clamps as the preperitoneal space was penetrated with a Kelly clamp into the peritoneum.  Once this was done, a pursestring suture of 0 Vicryl was passed around the fascial opening.  This was subsequently used to secure the [employer] cannula which was passed into the peritoneal cavity.  Once the [facility] cannula was in place, carbon dioxide gas was insufflated into the peritoneal cavity up to a maximal intra-abdominal pressure of 15mm Hg.The laparoscope, with attached camera and light source, was passed into the peritoneal cavity to visualize the direct insertion of two right upper quadrant 5mm cannulas, and a sup-xiphoid 5mm cannula.  Once all cannulas were in place, the dissection was begun.  Two ratcheted graspers were attached to the dome and infundibulum of the gallbladder and retracted towards the  anterior abdominal wall and the right upper quadrant.  Using cautery attached to a dissecting forceps, the peritoneum overlaying the triangle of Chalot and the hepatoduodenal triangle was dissected away exposing the cystic duct and the cystic artery.  A clip was placed on the gallbladder side of the cystic duct, then  the distal cystic duct was clipped multiple times then transected.  The gallbladder was then dissected out of the hepatic bed without event.  It was retrieved from the abdomen without event.  Once the gallbladder was removed, the bed was inspected for hemostasis.  Once excellent hemostasis was obtained all gas and fluids were aspirated from above the liver, then the cannulas were removed.  The supra-umbilical incision was closed using the pursestring suture which was in place.  0.25% bupivicaine with epinephrine was injected at all sites.  All 10mm or greater cannula sites were close using a running subcuticular stitch of 4-0 Monocryl.  5.68mm cannula sites were closed with Dermabond only.Steri-Strips and Tagaderm were used to complete the dressings at all sites.  At this point all needle, sponge, and instrument counts were correct.The patient was awakened from anesthesia and taken to the PACU in stable condition  PATIENT DISPOSITION:  PACU - hemodynamically stable.   Cherylynn Ridges 4/21/201411:00 AM

## 2013-03-01 NOTE — ED Provider Notes (Signed)
History     CSN: [id number]  Arrival date & time 02/28/13  2024   First MD Initiated Contact with Patient 02/28/13 2301      Chief Complaint  Patient presents with  . Abdominal Pain    (Consider location/radiation/quality/duration/timing/severity/associated sxs/prior treatment) HPI 57 yo female presents to the ER with complaint of abdominal pain and nausea.  Pt c/o abdominal and back cramping for some time, pt estimates going on for several months, but much worse today.  No fevers, chills, no vomiting or diarrhea.  Pain worse in RUQ with radiation into her back.  Pt feels pain is due to muscle spasms, as pain comes in waves.  No prior w/u for symptoms.  Pt had 2 beers earlier today, smokes less than 1 ppd.  No prior abdominal surgeries.  Past Medical History  Diagnosis Date  . Hypertension   . Hyperlipidemia   . Arthritis   . Fibromyalgia     History reviewed. No pertinent past surgical history.  Family History  Problem Relation Age of Onset  . Colon cancer Neg Hx   . Colon polyps Neg Hx   . Stomach cancer Neg Hx     History  Substance Use Topics  . Smoking status: Current Every Day Smoker -- 0.80 packs/day for 20 years    Types: Cigarettes  . Smokeless tobacco: Never Used  . Alcohol Use: 0.6 oz/week    1 Cans of beer per week    OB History   Grav Para Term Preterm Abortions TAB SAB Ect Mult Living                  Review of Systems  All other systems reviewed and are negative.    Allergies  Review of patient's allergies indicates no known allergies.  Home Medications  No current outpatient prescriptions on file.  BP 108/62  Pulse 67  Temp(Src) 98 F (36.7 C) (Oral)  Resp 20  Ht 5\' 7"  (1.702 m)  Wt 263 lb 0.1 oz (119.3 kg)  BMI 41.18 kg/m2  SpO2 96%  Physical Exam  Nursing note and vitals reviewed. Constitutional: She is oriented to person, place, and time. She appears well-developed and well-nourished.  HENT:  Head: Normocephalic and  atraumatic.  Nose: Nose normal.  Mouth/Throat: Oropharynx is clear and moist.  Eyes: Conjunctivae and EOM are normal. Pupils are equal, round, and reactive to light.  Neck: Normal range of motion. Neck supple. No JVD present. No tracheal deviation present. No thyromegaly present.  Cardiovascular: Normal rate, regular rhythm, normal heart sounds and intact distal pulses.  Exam reveals no gallop and no friction rub.   No murmur heard. Pulmonary/Chest: Effort normal and breath sounds normal. No stridor. No respiratory distress. She has no wheezes. She has no rales. She exhibits no tenderness.  Abdominal: Soft. Bowel sounds are normal. She exhibits no distension and no mass. There is tenderness (TTP over RUQ with +murphy sign). There is no rebound and no guarding.  Musculoskeletal: Normal range of motion. She exhibits no edema and no tenderness.  Lymphadenopathy:    She has no cervical adenopathy.  Neurological: She is alert and oriented to person, place, and time. She exhibits normal muscle tone. Coordination normal.  Skin: Skin is warm and dry. No rash noted. No erythema. No pallor.  Psychiatric: She has a normal mood and affect. Her behavior is normal. Judgment and thought content normal.    ED Course  Procedures (including critical care time)  Labs Reviewed  CBC  WITH DIFFERENTIAL - Abnormal; Notable for the following:    WBC 22.6 (*)    RBC 5.36 (*)    MCV 75.9 (*)    MCH 25.9 (*)    RDW 16.7 (*)    Neutro Abs 16.6 (*)    Lymphs Abs 4.6 (*)    Monocytes Absolute 1.2 (*)    All other components within normal limits  COMPREHENSIVE METABOLIC PANEL - Abnormal; Notable for the following:    Sodium 131 (*)    Glucose, Bld 110 (*)    Total Protein 8.6 (*)    Albumin 3.4 (*)    Alkaline Phosphatase 137 (*)    GFR calc non Af Amer 77 (*)    GFR calc Af Amer 89 (*)    All other components within normal limits  LIPASE, BLOOD - Abnormal; Notable for the following:    Lipase 61 (*)     All other components within normal limits  URINALYSIS, MICROSCOPIC ONLY - Abnormal; Notable for the following:    Squamous Epithelial / LPF FEW (*)    Casts GRANULAR CAST (*)    All other components within normal limits  COMPREHENSIVE METABOLIC PANEL - Abnormal; Notable for the following:    Glucose, Bld 132 (*)    Albumin 3.2 (*)    Alkaline Phosphatase 127 (*)    GFR calc non Af Amer 76 (*)    GFR calc Af Amer 88 (*)    All other components within normal limits  CBC - Abnormal; Notable for the following:    WBC 22.1 (*)    MCV 77.6 (*)    MCH 25.9 (*)    RDW 16.4 (*)    All other components within normal limits  SURGICAL PCR SCREEN  MAGNESIUM  PHOSPHORUS  URINALYSIS, DIPSTICK ONLY  CBC  CBC  CBC   US Abdomen Complete  03/01/2013  *RADIOLOGY REPORT*  Clinical Data:  Right upper quadrant abdominal pain.  COMPLETE ABDOMINAL ULTRASOUND  Comparison:  None.  Findings:  Gallbladder:  Gallstones.  Limited evaluation for wall thickening given the shadowing stones.  No pericholecystic fluid visualized. Positive Murphy's sign per the sonographer.  Common bile duct:  Poorly visualized due to the shadowing gallbladder stones.  The proximal portion measures 5 mm.  Liver:  Diffusely increased in echogenicity.  Focal lesion detection is limited in this setting.  IVC:  Poorly visualized  Pancreas:  Poorly visualized throughout due to bowel gas artifact/limited acoustic windows.  Spleen:  Measures 8 cm.  No focal abnormality.  Right Kidney:  Measures 12.4 cm.  No hydronephrosis or focal abnormality.  Left Kidney:  Measures 11.5 cm.  No hydronephrosis or focal abnormality.  Abdominal aorta:  Inadequately visualized.  No aneurysm where seen  IMPRESSION: Technically challenging examination due to limited acoustic windows, bowel gas artifact, and shadowing from gallstones.  Gallstones with a positive sonographic Murphy's sign raises concern for acute cholecystitis.  Hepatic steatosis.  CBD, pancreas, and  aorta inadequately visualized.   Original Report Authenticated By: Jearld Lesch, M.D.      1. Acute cholecystitis       MDM  57 year old female with acute on chronic right upper quadrant pain with radiation to her back.  She is noted to have elevation of her white blood cell count, as well as slight elevation in her lipase.  Ultrasound shows multiple gallstones, and positive Murphy sign.  No pericholecystic fluid or gallbladder wall thickening.  Given her lab abnormalities, and concern for acute  cholecystitis.  Contacted Dr. Donell Beers, on call for surgery, who will see the patient.       Olivia Mackie, MD 03/01/13 [phone]

## 2013-03-02 ENCOUNTER — Encounter (HOSPITAL_COMMUNITY): Payer: Self-pay | Admitting: General Surgery

## 2013-03-02 ENCOUNTER — Other Ambulatory Visit (INDEPENDENT_AMBULATORY_CARE_PROVIDER_SITE_OTHER): Payer: Self-pay | Admitting: *Deleted

## 2013-03-02 DIAGNOSIS — Z9049 Acquired absence of other specified parts of digestive tract: Secondary | ICD-10-CM

## 2013-03-02 LAB — CBC WITH DIFFERENTIAL/PLATELET
Basophils Absolute: 0 10*3/uL (ref 0.0–0.1)
Basophils Relative: 0 % (ref 0–1)
Eosinophils Absolute: 0 10*3/uL (ref 0.0–0.7)
MCH: 25.4 pg — ABNORMAL LOW (ref 26.0–34.0)
MCHC: 32.9 g/dL (ref 30.0–36.0)
Monocytes Relative: 5 % (ref 3–12)
Neutrophils Relative %: 81 % — ABNORMAL HIGH (ref 43–77)
Platelets: 295 10*3/uL (ref 150–400)
RDW: 16.7 % — ABNORMAL HIGH (ref 11.5–15.5)

## 2013-03-02 MED ORDER — HYDROCODONE-ACETAMINOPHEN 5-325 MG PO TABS: 1.0000 | ORAL_TABLET | Freq: Four times a day (QID) | ORAL | Status: DC | PRN

## 2013-03-02 NOTE — Progress Notes (Signed)
Physician Discharge Summary  Patient ID: Mariah Martinez MRN: [id number] DOB/AGE: 1956/10/11 57 y.o.  Admit date: 02/28/2013 Discharge date: 03/02/2013  Admitting Diagnosis: Acute cholecystitis  Discharge Diagnosis Patient Active Problem List   Diagnosis Date Noted  . Blood in stool 07/10/2011  . 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 Segmental colitis 07/10/2011  . Diverticulosis of colon (without mention of hemorrhage) 07/10/2011    Consultants None  Imaging: US Abdomen Complete  03/01/2013  *RADIOLOGY REPORT*  Clinical Data:  Right upper quadrant abdominal pain.  COMPLETE ABDOMINAL ULTRASOUND  Comparison:  None.  Findings:  Gallbladder:  Gallstones.  Limited evaluation for wall thickening given the shadowing stones.  No pericholecystic fluid visualized. Positive Murphy's sign per the sonographer.  Common bile duct:  Poorly visualized due to the shadowing gallbladder stones.  The proximal portion measures 5 mm.  Liver:  Diffusely increased in echogenicity.  Focal lesion detection is limited in this setting.  IVC:  Poorly visualized  Pancreas:  Poorly visualized throughout due to bowel gas artifact/limited acoustic windows.  Spleen:  Measures 8 cm.  No focal abnormality.  Right Kidney:  Measures 12.4 cm.  No hydronephrosis or focal abnormality.  Left Kidney:  Measures 11.5 cm.  No hydronephrosis or focal abnormality.  Abdominal aorta:  Inadequately visualized.  No aneurysm where seen  IMPRESSION: Technically challenging examination due to limited acoustic windows, bowel gas artifact, and shadowing from gallstones.  Gallstones with a positive sonographic Murphy's sign raises concern for acute cholecystitis.  Hepatic steatosis.  CBD, pancreas, and aorta inadequately visualized.   Original Report Authenticated By: Jearld Lesch, M.D.     Procedures Laparoscopic Cholecystectomy  Hospital Course:  57 yo F with pain for around 18 hours prior to admission that has been constant in the  upper abdomen. She has had attacks of pain that are similar over the past few months. The other ones have resolved spontaneously with rest. She does think the severe attacks of pain are related to cream/other fried food. She typically has nausea with the other attacks as well.   Workup showed acute cholecystitis with leukocytosis.  Patient was admitted and underwent procedure listed above.  Tolerated procedure well and was transferred to the floor.  Diet was advanced as tolerated.  Leukocytosis improved from 22 to 17.6.  We will check another CBC in 1 week to ensure its trending down.  On POD #1, the patient was voiding well, tolerating diet, ambulating well, pain well controlled, vital signs stable, incisions c/d/i and felt stable for discharge home.  Patient will follow up in our office in 2 weeks and knows to call with questions or concerns.  Physical Exam: General:  Alert, NAD, pleasant, comfortable Abd:  Soft, ND, mild tenderness, incisions C/D/I    Medication List    TAKE these medications       aspirin 81 MG tablet  Take 81 mg by mouth daily.     cholecalciferol 1000 UNITS tablet  Commonly known as:  VITAMIN D  Take 1,000 Units by mouth daily.     cyclobenzaprine 10 MG tablet  Commonly known as:  FLEXERIL  Take 10 mg by mouth 3 (three) times daily as needed for muscle spasms.     HYDROcodone-acetaminophen 5-325 MG per tablet  Commonly known as:  NORCO/VICODIN  Take 1-2 tablets by mouth every 6 (six) hours as needed.     ibuprofen 800 MG tablet  Commonly known as:  ADVIL,MOTRIN  Take 800 mg by mouth every 8 (eight)  hours as needed for pain.     lisinopril-hydrochlorothiazide 20-12.5 MG per tablet  Commonly known as:  PRINZIDE,ZESTORETIC  Take 1 tablet by mouth daily.     vitamin E 600 UNIT capsule  Take 600 Units by mouth daily.             Follow-up Information   Follow up with [facility] On 03/16/2013. (APPT AT 11:15AM ON MAY 6TH, PLEASE ARRIVE AT  10:45AM FOR CHECK IN)    Contact information:   [address]   [place] [address] [phone]       Signed: Candiss Norse [facility] [phone]  03/02/2013, 8:35 AM

## 2013-03-02 NOTE — Progress Notes (Signed)
Okay to go home.  Will recheck her CBCD in about one week.  Marta Lamas. Gae Bon, MD, FACS [phone] [facility]

## 2013-03-02 NOTE — Progress Notes (Signed)
Entering CBC w/diff for [employer] [facility]

## 2013-03-02 NOTE — Progress Notes (Signed)
Pt discharged to home

## 2013-03-08 ENCOUNTER — Encounter (HOSPITAL_COMMUNITY): Payer: Self-pay

## 2013-03-08 ENCOUNTER — Emergency Department (INDEPENDENT_AMBULATORY_CARE_PROVIDER_SITE_OTHER)
Admission: EM | Admit: 2013-03-08 | Discharge: 2013-03-08 | Disposition: A | Discharge status: Home / Self Care | Payer: Self-pay | Source: Home / Self Care

## 2013-03-08 DIAGNOSIS — D126 Benign neoplasm of colon, unspecified: Secondary | ICD-10-CM

## 2013-03-08 LAB — CBC
HCT: 38.5 % (ref 36.0–46.0)
Hemoglobin: 12.7 g/dL (ref 12.0–15.0)
MCHC: 33 g/dL (ref 30.0–36.0)
RBC: 4.98 MIL/uL (ref 3.87–5.11)
WBC: 16.4 10*3/uL — ABNORMAL HIGH (ref 4.0–10.5)

## 2013-03-08 MED ORDER — SENNOSIDES-DOCUSATE SODIUM 8.6-50 MG PO TABS: 2.0000 | ORAL_TABLET | Freq: Every day | ORAL | Status: DC

## 2013-03-08 NOTE — ED Provider Notes (Signed)
History     CSN: [id number]  Arrival date & time 03/08/13  3381  57 year old female who comes in for a followup of her WBC count  She was recently diagnosed with acute cholecystitis and had a laparoscopic cholecystectomy on 4/21. Postoperatively she has had mild constipation The sutures remain intact except for mild drainage from one of them She is a followup on 5/6 with Dr. Lindie Spruce. The  Chief Complaint  Patient presents with  . Follow-up    (Consider location/radiation/quality/duration/timing/severity/associated sxs/prior treatment) HPI  Past Medical History  Diagnosis Date  . Hypertension   . Hyperlipidemia   . Arthritis   . Fibromyalgia     Past Surgical History  Procedure Laterality Date  . Cholecystectomy N/A 03/01/2013    Procedure: LAPAROSCOPIC CHOLECYSTECTOMY;  Surgeon: Cherylynn Ridges, MD;  Location: [facility] OR;  Service: General;  Laterality: N/A;    Family History  Problem Relation Age of Onset  . Colon cancer Neg Hx   . Colon polyps Neg Hx   . Stomach cancer Neg Hx     History  Substance Use Topics  . Smoking status: Current Every Day Smoker -- 0.80 packs/day for 20 years    Types: Cigarettes  . Smokeless tobacco: Never Used  . Alcohol Use: 0.6 oz/week    1 Cans of beer per week    OB History   Grav Para Term Preterm Abortions TAB SAB Ect Mult Living                  Review of Systems  Allergies  Review of patient's allergies indicates no known allergies.  Home Medications   Current Outpatient Rx  Name  Route  Sig  Dispense  Refill  . aspirin 81 MG tablet   Oral   Take 81 mg by mouth daily.           . cholecalciferol (VITAMIN D) 1000 UNITS tablet   Oral   Take 1,000 Units by mouth daily.         . cyclobenzaprine (FLEXERIL) 10 MG tablet   Oral   Take 10 mg by mouth 3 (three) times daily as needed for muscle spasms.          Marland Kitchen HYDROcodone-acetaminophen (NORCO/VICODIN) 5-325 MG per tablet   Oral   Take 1-2 tablets by mouth every 6  (six) hours as needed.   40 tablet   0   . ibuprofen (ADVIL,MOTRIN) 800 MG tablet   Oral   Take 800 mg by mouth every 8 (eight) hours as needed for pain.          Marland Kitchen lisinopril-hydrochlorothiazide (PRINZIDE,ZESTORETIC) 20-12.5 MG per tablet   Oral   Take 1 tablet by mouth daily.         . vitamin E 600 UNIT capsule   Oral   Take 600 Units by mouth daily.           BP 136/82  Pulse 78  Temp(Src) 98.2 F (36.8 C) (Oral)  Resp 16  SpO2 100%  Physical Exam Eyes: Conjunctivae are normal. Pupils are equal, round, and reactive to light. No scleral icterus.  Neck: Normal range of motion. Neck supple. No thyromegaly present.  Cardiovascular: Normal rate, regular rhythm, normal heart sounds and intact distal pulses. Exam reveals no gallop and no friction rub.  No murmur heard.  Respiratory: Effort normal and breath sounds normal.  GI: Soft. Bowel sounds are normal. She exhibits distension. She exhibits no mass. There is tenderness (RUQ/epigastric). There  is no rebound and no guarding.  Musculoskeletal: Normal range of motion. She exhibits no edema and no tenderness.  Lymphadenopathy:  She has no cervical adenopathy.  Neurological: She is alert and oriented to person, place, and time. Coordination normal.  Skin: Skin is dry. No rash noted. She is not diaphoretic. No erythema. No pallor.  Psychiatric: She has a normal mood and affect. Her behavior is normal. Judgment and thought content normal  ED Course  Procedures (including critical care time)  Labs Reviewed - No data to display No results found.   No diagnosis found.    MDM  Leukocytosis Acute cholecystitis status post cholecystectomy  Plan Redraw CBC to ensure resolution Will prescribe Colace and senna for constipation No repeat followup needed for this issue unless white count has not resolved         Richarda Overlie, MD 03/08/13 1139

## 2013-03-08 NOTE — ED Notes (Signed)
Hospital follow up Had gall bladder removed

## 2013-03-16 ENCOUNTER — Encounter (INDEPENDENT_AMBULATORY_CARE_PROVIDER_SITE_OTHER): Payer: Self-pay

## 2013-03-30 ENCOUNTER — Ambulatory Visit (INDEPENDENT_AMBULATORY_CARE_PROVIDER_SITE_OTHER): Payer: Self-pay | Admitting: Internal Medicine

## 2013-03-30 ENCOUNTER — Encounter (INDEPENDENT_AMBULATORY_CARE_PROVIDER_SITE_OTHER): Payer: Self-pay | Admitting: Internal Medicine

## 2013-03-30 VITALS — BP 130/84 | HR 85 | Temp 96.4°F | Resp 18 | Ht 67.0 in | Wt 266.2 lb

## 2013-03-30 DIAGNOSIS — K810 Acute cholecystitis: Secondary | ICD-10-CM

## 2013-03-30 MED ORDER — IBUPROFEN 800 MG PO TABS: 800.0000 mg | ORAL_TABLET | Freq: Three times a day (TID) | ORAL | Status: DC | PRN

## 2013-03-30 NOTE — Patient Instructions (Signed)
May resume regular activity without restrictions. Follow up as needed. Call with questions or concerns.  

## 2013-03-30 NOTE — Progress Notes (Signed)
  Subjective: Pt returns to the clinic today after undergoing laparoscopic cholecystectomy on 03/01/13 by Dr. Lindie Spruce.  The patient is tolerating their diet well and is having no severe pain.  Bowel function is good.  No problems with the wounds.  Objective: Vital signs in last 24 hours: Reviewed  PE: Abd: soft, non-tender, +bs, incisions well healed  Lab Results:  No results found for this basename: WBC, HGB, HCT, PLT,  in the last 72 hours BMET No results found for this basename: NA, K, CL, CO2, GLUCOSE, BUN, CREATININE, CALCIUM,  in the last 72 hours PT/INR No results found for this basename: LABPROT, INR,  in the last 72 hours CMP     Component Value Date/Time   NA 137 03/01/2013 0635   K 3.8 03/01/2013 0635   CL 101 03/01/2013 0635   CO2 28 03/01/2013 0635   GLUCOSE 132* 03/01/2013 0635   BUN 17 03/01/2013 0635   CREATININE 0.84 03/01/2013 0635   CALCIUM 9.6 03/01/2013 0635   PROT 7.7 03/01/2013 0635   ALBUMIN 3.2* 03/01/2013 0635   AST 15 03/01/2013 0635   ALT 13 03/01/2013 0635   ALKPHOS 127* 03/01/2013 0635   BILITOT 0.5 03/01/2013 0635   GFRNONAA 76* 03/01/2013 0635   GFRAA 88* 03/01/2013 0635   Lipase     Component Value Date/Time   LIPASE 61* 02/28/2013 2129       Studies/Results: No results found.  Anti-infectives: Anti-infectives   None       Assessment/Plan  1.  S/P Laparoscopic Cholecystectomy: doing well, may resume regular activity without restrictions, Pt will follow up with [place] PRN and knows to call with questions or concerns.     Mariah Martinez 03/30/2013

## 2013-07-19 ENCOUNTER — Ambulatory Visit: Payer: No Typology Code available for payment source | Attending: Family Medicine | Admitting: Internal Medicine

## 2013-07-19 VITALS — BP 116/71 | HR 73 | Temp 98.2°F | Resp 16 | Wt 261.8 lb

## 2013-07-19 DIAGNOSIS — Z860100 Personal history of colon polyps, unspecified: Secondary | ICD-10-CM | POA: Insufficient documentation

## 2013-07-19 DIAGNOSIS — M2550 Pain in unspecified joint: Secondary | ICD-10-CM | POA: Insufficient documentation

## 2013-07-19 DIAGNOSIS — M545 Low back pain, unspecified: Secondary | ICD-10-CM | POA: Insufficient documentation

## 2013-07-19 DIAGNOSIS — M797 Fibromyalgia: Secondary | ICD-10-CM

## 2013-07-19 DIAGNOSIS — IMO0001 Reserved for inherently not codable concepts without codable children: Secondary | ICD-10-CM | POA: Insufficient documentation

## 2013-07-19 DIAGNOSIS — E785 Hyperlipidemia, unspecified: Secondary | ICD-10-CM | POA: Insufficient documentation

## 2013-07-19 DIAGNOSIS — I10 Essential (primary) hypertension: Secondary | ICD-10-CM

## 2013-07-19 LAB — BASIC METABOLIC PANEL
CO2: 24 mEq/L (ref 19–32)
Calcium: 9.8 mg/dL (ref 8.4–10.5)
Potassium: 3.8 mEq/L (ref 3.5–5.3)
Sodium: 139 mEq/L (ref 135–145)

## 2013-07-19 LAB — SEDIMENTATION RATE: Sed Rate: 32 mm/hr — ABNORMAL HIGH (ref 0–22)

## 2013-07-19 MED ORDER — PRAVASTATIN SODIUM 20 MG PO TABS: 20.0000 mg | ORAL_TABLET | Freq: Every day | ORAL | Status: DC

## 2013-07-19 MED ORDER — LISINOPRIL-HYDROCHLOROTHIAZIDE 20-12.5 MG PO TABS: 1.0000 | ORAL_TABLET | Freq: Every day | ORAL | Status: DC

## 2013-07-19 MED ORDER — SIMVASTATIN 20 MG PO TABS: 10.0000 mg | ORAL_TABLET | Freq: Every day | ORAL | Status: DC

## 2013-07-19 MED ORDER — GABAPENTIN 100 MG PO CAPS: 100.0000 mg | ORAL_CAPSULE | Freq: Two times a day (BID) | ORAL | Status: DC

## 2013-07-19 MED ORDER — CYCLOBENZAPRINE HCL 10 MG PO TABS: 10.0000 mg | ORAL_TABLET | Freq: Three times a day (TID) | ORAL | Status: DC | PRN

## 2013-07-19 MED ORDER — MELOXICAM 15 MG PO TABS: 15.0000 mg | ORAL_TABLET | Freq: Every day | ORAL | Status: DC

## 2013-07-19 NOTE — Addendum Note (Signed)
Addended by: Maretta Bees on: 07/19/2013 03:03 PM   Modules accepted: Orders, Medications

## 2013-07-19 NOTE — Progress Notes (Signed)
Patient Demographics  Mariah Martinez, is a 57 y.o. female  HYQ:[id number]  [id number]:[id number]  DOB - [birth year]  Chief Complaint  Patient presents with  . Hypertension        Subjective:   Mariah Martinez today is here for a follow up visit. She claims to have a past medical history of hypertension, insomnia, fibromyalgia. For the past month or so, she claims to have generalized muscle aches, generalized arthralgias. She has problems sleeping. Previously she was on doxepin which helped her with sleep. She has been taking ibuprofen 800 mg 3 times a day for pain. She denies any joint swelling, fever. She claims she gets muscle spasms occasionally, and for the past 1 or 2 weeks this has worsened in frequency.  Patient has No headache, No chest pain, No abdominal pain - No Nausea, No new weakness tingling or numbness, No Cough - SOB.   Objective:    Filed Vitals:   07/19/13 1408  BP: 116/71  Pulse: 73  Temp: 98.2 F (36.8 C)  Resp: 16  Weight: 261 lb 12.8 oz (118.752 kg)  SpO2: 100%     ALLERGIES:  No Known Allergies  PAST MEDICAL HISTORY: Past Medical History  Diagnosis Date  . Hypertension   . Hyperlipidemia   . Arthritis   . Fibromyalgia     MEDICATIONS AT HOME: Prior to Admission medications   Medication Sig Start Date End Date Taking? Authorizing Provider  cyclobenzaprine (FLEXERIL) 10 MG tablet Take 1 tablet (10 mg total) by mouth 3 (three) times daily as needed for muscle spasms. 07/19/13  Yes Shanker Levora Dredge, MD  doxepin (SINEQUAN) 25 MG capsule Take 25 mg by mouth. 1 or 2 pills qhs prn   Yes Historical Provider, MD  pravastatin (PRAVACHOL) 20 MG tablet Take 1 tablet (20 mg total) by mouth daily. 07/19/13  Yes Shanker Levora Dredge, MD  aspirin 81 MG tablet Take 81 mg by mouth daily.      Historical Provider, MD  cholecalciferol (VITAMIN D) 1000 UNITS tablet Take 1,000 Units by mouth daily.    Historical Provider, MD  gabapentin (NEURONTIN) 100 MG capsule Take 1  capsule (100 mg total) by mouth 2 (two) times daily. 07/19/13   Shanker Levora Dredge, MD  lisinopril-hydrochlorothiazide (PRINZIDE,ZESTORETIC) 20-12.5 MG per tablet Take 1 tablet by mouth daily. 07/19/13   Shanker Levora Dredge, MD  meloxicam (MOBIC) 15 MG tablet Take 1 tablet (15 mg total) by mouth daily. 07/19/13   Shanker Levora Dredge, MD  vitamin E 600 UNIT capsule Take 600 Units by mouth daily.    Historical Provider, MD     Exam  General appearance :Awake, alert, not in any distress. Speech Clear. Not toxic Looking HEENT: Atraumatic and Normocephalic, pupils equally reactive to light and accomodation Neck: supple, no JVD. No cervical lymphadenopathy.  Chest:Good air entry bilaterally, no added sounds  CVS: S1 S2 regular, no murmurs.  Abdomen: Bowel sounds present, Non tender and not distended with no gaurding, rigidity or rebound. Extremities: B/L Lower Ext shows no edema, both legs are warm to touch. No joint swelling noted. No joint tenderness noted- except for mild tenderness in the right shoulder area. Range of motion is intact. Neurology: Awake alert, and oriented X 3, CN II-XII intact, Non focal Skin:No Rash Wounds:N/A    Data Review   CBC No results found for this basename: WBC, HGB, HCT, PLT, MCV, MCH, MCHC, RDW, NEUTRABS, LYMPHSABS, MONOABS, EOSABS, BASOSABS, BANDABS, BANDSABD,  in the last 168 hours  Chemistries  No results found for this basename: NA, K, CL, CO2, GLUCOSE, BUN, CREATININE, GFRCGP, CALCIUM, MG, AST, ALT, ALKPHOS, BILITOT,  in the last 168 hours ------------------------------------------------------------------------------------------------------------------ No results found for this basename: HGBA1C,  in the last 72 hours ------------------------------------------------------------------------------------------------------------------ No results found for this basename: CHOL, HDL, LDLCALC, TRIG, CHOLHDL, LDLDIRECT,  in the last 72  hours ------------------------------------------------------------------------------------------------------------------ No results found for this basename: TSH, T4TOTAL, FREET3, T3FREE, THYROIDAB,  in the last 72 hours ------------------------------------------------------------------------------------------------------------------ No results found for this basename: VITAMINB12, FOLATE, FERRITIN, TIBC, IRON, RETICCTPCT,  in the last 72 hours  Coagulation profile  No results found for this basename: INR, PROTIME,  in the last 168 hours    Assessment & Plan   Hypertension - Continue with combination of lisinopril/HCTZ.  Probable fibromyalgia - Resume doxepin for sleep - Start low-dose Neurontin - Stop ibuprofen and change to meloxicam - Claims to have muscle spasms-on HCTZ-will check ESR and chemistry panel today  Dyslipidemia - Not taking Pravachol as she ran out of it-resume - Check LDL prior to next visit  Health Maintenance -Colonoscopy: Done in 2012-polyp found-hyperplastic polyp-next due 2022 -Pap Smear: Claims she recently had it done in [place] in the beginning of this year, due in January of 2015 -Mammogram: Will order   We'll also order lipid panel, TSH and A1c-please check prior at next visit  Follow up in one month  The patient was given clear instructions to go to ER or return to medical center if symptoms don't improve, worsen or new problems develop. The patient verbalized understanding. The patient was told to call to get lab results if they haven't heard anything in the next week.

## 2013-07-19 NOTE — Progress Notes (Signed)
Patient here to establish care History of HTN 

## 2013-08-10 ENCOUNTER — Encounter (INDEPENDENT_AMBULATORY_CARE_PROVIDER_SITE_OTHER): Payer: Self-pay

## 2013-08-11 ENCOUNTER — Ambulatory Visit: Payer: No Typology Code available for payment source | Attending: Internal Medicine

## 2013-08-11 DIAGNOSIS — I10 Essential (primary) hypertension: Secondary | ICD-10-CM

## 2013-08-11 LAB — LIPID PANEL
Cholesterol: 279 mg/dL — ABNORMAL HIGH (ref 0–200)
Total CHOL/HDL Ratio: 6.3 Ratio
Triglycerides: 138 mg/dL (ref <150)

## 2013-08-11 LAB — TSH: TSH: 0.159 u[IU]/mL — ABNORMAL LOW (ref 0.350–4.500)

## 2013-08-16 LAB — HEMOGLOBIN A1C

## 2013-08-18 ENCOUNTER — Ambulatory Visit: Payer: No Typology Code available for payment source

## 2013-08-18 ENCOUNTER — Encounter: Payer: Self-pay | Admitting: Internal Medicine

## 2013-08-18 ENCOUNTER — Ambulatory Visit: Payer: No Typology Code available for payment source | Attending: Internal Medicine | Admitting: Internal Medicine

## 2013-08-18 VITALS — BP 124/90 | HR 111 | Temp 98.0°F | Resp 16 | Ht 67.0 in | Wt 261.0 lb

## 2013-08-18 DIAGNOSIS — R000 Tachycardia, unspecified: Secondary | ICD-10-CM | POA: Insufficient documentation

## 2013-08-18 DIAGNOSIS — E079 Disorder of thyroid, unspecified: Secondary | ICD-10-CM

## 2013-08-18 MED ORDER — LABETALOL HCL 100 MG PO TABS: 100.0000 mg | ORAL_TABLET | Freq: Every day | ORAL | Status: DC

## 2013-08-18 NOTE — Progress Notes (Signed)
Pt is here to follow up on her HTN. Pt reports that she is having spasms in throat.   Pt is here to review her lab results Pt needs a medication review.

## 2013-08-19 NOTE — Patient Instructions (Signed)
Hyperthyroidism  The thyroid is a large gland located in the lower front part of your neck. The thyroid helps control metabolism. Metabolism is how your body uses food. It controls metabolism with the hormone thyroxine. When the thyroid is overactive, it produces too much hormone. When this happens, these following problems may occur:   · Nervousness  · Heat intolerance  · Weight loss (in spite of increase food intake)  · Diarrhea  · Change in hair or skin texture  · Palpitations (heart skipping or having extra beats)  · Tachycardia (rapid heart rate)  · Loss of menstruation (amenorrhea)  · Shaking of the hands  CAUSES  · Grave's Disease (the immune system attacks the thyroid gland). This is the most common cause.  · Inflammation of the thyroid gland.  · Tumor (usually benign) in the thyroid gland or elsewhere.  · Excessive use of thyroid medications (both prescription and 'natural').  · Excessive ingestion of Iodine.  DIAGNOSIS   To prove hyperthyroidism, your caregiver may do blood tests and ultrasound tests. Sometimes the signs are hidden. It may be necessary for your caregiver to watch this illness with blood tests, either before or after diagnosis and treatment.  TREATMENT  Short-term treatment  There are several treatments to control symptoms. Drugs called beta blockers may give some relief. Drugs that decrease hormone production will provide temporary relief in many people. These measures will usually not give permanent relief.  Definitive therapy  There are treatments available which can be discussed between you and your caregiver which will permanently treat the problem. These treatments range from surgery (removal of the thyroid), to the use of radioactive iodine (destroys the thyroid by radiation), to the use of antithyroid drugs (interfere with hormone synthesis). The first two treatments are permanent and usually successful. They most often require hormone replacement therapy for life. This is because  it is impossible to remove or destroy the exact amount of thyroid required to make a person euthyroid (normal).  HOME CARE INSTRUCTIONS   See your caregiver if the problems you are being treated for get worse. Examples of this would be the problems listed above.  SEEK MEDICAL CARE IF:  Your general condition worsens.  MAKE SURE YOU:   · Understand these instructions.  · Will watch your condition.  · Will get help right away if you are not doing well or get worse.  Document Released: 10/28/2005 Document Revised: 01/20/2012 Document Reviewed: 03/11/2007  [employer]® Patient Information ©2014 [employer].

## 2013-08-19 NOTE — Progress Notes (Signed)
Patient ID: Mariah Martinez, female   DOB: 1956/11/07, 57 y.o.   MRN: [id number]   CC:  Regular followup  HPI: Patient is 57 year old female who presents to clinic for followup, needs refills on her medications. She denies recent sicknesses or hospitalizations, she takes medicines as prescribed but reports running out of her blood pressure medicines. She denies chest pain or shortness of breath, no recent episodes of abdominal or urinary concerns, no blood in stool or urine noticed, no specific symptoms on today's visit.  No Known Allergies Past Medical History  Diagnosis Date  . Hypertension   . Hyperlipidemia   . Arthritis   . Fibromyalgia    Current Outpatient Prescriptions on File Prior to Visit  Medication Sig Dispense Refill  . aspirin 81 MG tablet Take 81 mg by mouth daily.        . cholecalciferol (VITAMIN D) 1000 UNITS tablet Take 1,000 Units by mouth daily.      . cyclobenzaprine (FLEXERIL) 10 MG tablet Take 1 tablet (10 mg total) by mouth 3 (three) times daily as needed for muscle spasms.  60 tablet  0  . doxepin (SINEQUAN) 25 MG capsule Take 25 mg by mouth. 1 or 2 pills qhs prn      . lisinopril-hydrochlorothiazide (PRINZIDE,ZESTORETIC) 20-12.5 MG per tablet Take 1 tablet by mouth daily.  30 tablet  3  . meloxicam (MOBIC) 15 MG tablet Take 1 tablet (15 mg total) by mouth daily.  30 tablet  0  . simvastatin (ZOCOR) 20 MG tablet Take 0.5 tablets (10 mg total) by mouth at bedtime.  90 tablet  3  . gabapentin (NEURONTIN) 100 MG capsule Take 1 capsule (100 mg total) by mouth 2 (two) times daily.  60 capsule  3  . vitamin E 600 UNIT capsule Take 600 Units by mouth daily.       No current facility-administered medications on file prior to visit.   Family History  Problem Relation Age of Onset  . Colon cancer Neg Hx   . Colon polyps Neg Hx   . Stomach cancer Neg Hx    History   Social History  . Marital Status: Single    Spouse Name: N/A    Number of Children: N/A  .  Years of Education: N/A   Occupational History  . Not on file.   Social History Main Topics  . Smoking status: Current Every Day Smoker -- 0.50 packs/day for 20 years    Types: Cigarettes  . Smokeless tobacco: Never Used  . Alcohol Use: 0.6 oz/week    1 Cans of beer per week     Comment: Monthly.  . Drug Use: No  . Sexual Activity: Not on file   Other Topics Concern  . Not on file   Social History Narrative  . No narrative on file    Review of Systems  Constitutional: Negative for fever, chills, diaphoresis, activity change, appetite change and fatigue.  HENT: Negative for ear pain, nosebleeds, congestion, facial swelling, rhinorrhea, neck pain, neck stiffness and ear discharge.   Eyes: Negative for pain, discharge, redness, itching and visual disturbance.  Respiratory: Negative for cough, choking, chest tightness, shortness of breath, wheezing and stridor.   Cardiovascular: Negative for chest pain, palpitations and leg swelling.  Gastrointestinal: Negative for abdominal distention.  Genitourinary: Negative for dysuria, urgency, frequency, hematuria, flank pain, decreased urine volume, difficulty urinating and dyspareunia.  Musculoskeletal: Negative for back pain, joint swelling, arthralgias and gait problem.  Neurological: Negative for dizziness,  tremors, seizures, syncope, facial asymmetry, speech difficulty, weakness, light-headedness, numbness and headaches.  Hematological: Negative for adenopathy. Does not bruise/bleed easily.  Psychiatric/Behavioral: Negative for hallucinations, behavioral problems, confusion, dysphoric mood, decreased concentration and agitation.    Objective:   Filed Vitals:   08/18/13 1446  BP: 124/90  Pulse: 111  Temp: 98 F (36.7 C)  Resp: 16    Physical Exam  Constitutional: Appears well-developed and well-nourished. No distress.  HENT: Normocephalic. External right and left ear normal. Oropharynx is clear and moist.  Eyes: Conjunctivae  and EOM are normal. PERRLA, no scleral icterus.  Neck: Normal ROM. Neck supple. No JVD. No tracheal deviation. No thyromegaly.  CVS: Regular rhythm, tachycardia, S1/S2 +, no murmurs, no gallops, no carotid bruit.  Pulmonary: Effort and breath sounds normal, no stridor, rhonchi, wheezes, rales.  Abdominal: Soft. BS +,  no distension, tenderness, rebound or guarding.  Musculoskeletal: Normal range of motion. No edema and no tenderness.  Lymphadenopathy: No lymphadenopathy noted, cervical, inguinal. Neuro: Alert. Normal reflexes, muscle tone coordination. No cranial nerve deficit. Skin: Skin is warm and dry. No rash noted. Not diaphoretic. No erythema. No pallor.  Psychiatric: Normal mood and affect. Behavior, judgment, thought content normal.   Lab Results  Component Value Date   WBC 16.4* 03/08/2013   HGB 12.7 03/08/2013   HCT 38.5 03/08/2013   MCV 77.3* 03/08/2013   PLT 336 03/08/2013   Lab Results  Component Value Date   CREATININE 0.90 07/19/2013   BUN 17 07/19/2013   NA 139 07/19/2013   K 3.8 07/19/2013   CL 107 07/19/2013   CO2 24 07/19/2013    Lab Results  Component Value Date   HGBA1C TEST NOT PERFORMED 08/11/2013   Lipid Panel     Component Value Date/Time   CHOL 279* 08/11/2013 1425   TRIG 138 08/11/2013 1425   HDL 44 08/11/2013 1425   CHOLHDL 6.3 08/11/2013 1425   VLDL 28 08/11/2013 1425   LDLCALC 207* 08/11/2013 1425       Assessment and plan:   Patient Active Problem List   Diagnosis Date Noted  .  thyroid dysfunction - unclear etiology, review of records indicate low TSH, will repeat today and followup. I will provide referral to endocrinologist. His TSH is persistently low will have to have patient come back and check T3 and T4.  07/10/2011   Tachycardia - unclear etiology but could be related to low TSH, hyperthyroidism. We'll prescribe low-dose labetalol to take once daily. Patient made aware of adding new medicine to the regimen. She has verbalized understanding

## 2013-09-13 ENCOUNTER — Telehealth: Payer: Self-pay | Admitting: Emergency Medicine

## 2013-09-13 NOTE — Telephone Encounter (Signed)
Pt requesting script Ibuprofen 800 mg tab. States you forgot to order

## 2013-09-14 ENCOUNTER — Telehealth: Payer: Self-pay | Admitting: Emergency Medicine

## 2013-09-14 DIAGNOSIS — D72829 Elevated white blood cell count, unspecified: Secondary | ICD-10-CM

## 2013-09-14 NOTE — Progress Notes (Signed)
Scheduled pt for lab visit 09/17/13

## 2013-09-14 NOTE — Addendum Note (Signed)
Addended by: Maretta Bees on: 09/14/2013 09:23 AM   Modules accepted: Orders

## 2013-09-16 ENCOUNTER — Ambulatory Visit: Payer: No Typology Code available for payment source | Attending: Internal Medicine

## 2013-09-16 DIAGNOSIS — I10 Essential (primary) hypertension: Secondary | ICD-10-CM

## 2013-09-16 LAB — T4, FREE: Free T4: 1 ng/dL (ref 0.80–1.80)

## 2013-09-27 ENCOUNTER — Ambulatory Visit: Payer: Self-pay

## 2013-10-27 ENCOUNTER — Ambulatory Visit: Payer: No Typology Code available for payment source | Attending: Internal Medicine | Admitting: Internal Medicine

## 2013-10-27 VITALS — BP 153/104 | HR 74 | Temp 98.7°F | Resp 14 | Ht 68.0 in | Wt 267.0 lb

## 2013-10-27 DIAGNOSIS — M549 Dorsalgia, unspecified: Secondary | ICD-10-CM

## 2013-10-27 DIAGNOSIS — I10 Essential (primary) hypertension: Secondary | ICD-10-CM | POA: Insufficient documentation

## 2013-10-27 LAB — CBC WITH DIFFERENTIAL/PLATELET
Basophils Absolute: 0 10*3/uL (ref 0.0–0.1)
Basophils Relative: 0 % (ref 0–1)
Eosinophils Absolute: 0.2 10*3/uL (ref 0.0–0.7)
MCH: 26.1 pg (ref 26.0–34.0)
MCHC: 33 g/dL (ref 30.0–36.0)
Neutrophils Relative %: 68 % (ref 43–77)
Platelets: 351 10*3/uL (ref 150–400)
RDW: 17.4 % — ABNORMAL HIGH (ref 11.5–15.5)

## 2013-10-27 LAB — COMPLETE METABOLIC PANEL WITH GFR
ALT: 15 U/L (ref 0–35)
CO2: 24 mEq/L (ref 19–32)
Calcium: 9.6 mg/dL (ref 8.4–10.5)
Chloride: 104 mEq/L (ref 96–112)
GFR, Est African American: >89 mL/min
Glucose, Bld: 105 mg/dL — ABNORMAL HIGH (ref 70–99)
Sodium: 139 mEq/L (ref 135–145)
Total Protein: 7.2 g/dL (ref 6.0–8.3)

## 2013-10-27 MED ORDER — MELOXICAM 15 MG PO TABS: 15.0000 mg | ORAL_TABLET | Freq: Every day | ORAL | Status: DC

## 2013-10-27 MED ORDER — CYCLOBENZAPRINE HCL 5 MG PO TABS: 5.0000 mg | ORAL_TABLET | Freq: Two times a day (BID) | ORAL | Status: DC | PRN

## 2013-10-27 MED ORDER — IBUPROFEN 600 MG PO TABS: 600.0000 mg | ORAL_TABLET | Freq: Three times a day (TID) | ORAL | Status: DC | PRN

## 2013-10-27 MED ORDER — CYCLOBENZAPRINE HCL 10 MG PO TABS: 10.0000 mg | ORAL_TABLET | Freq: Three times a day (TID) | ORAL | Status: DC | PRN

## 2013-10-27 MED ORDER — HYDROCHLOROTHIAZIDE 12.5 MG PO CAPS: 12.5000 mg | ORAL_CAPSULE | Freq: Every day | ORAL | Status: DC

## 2013-10-27 MED ORDER — LOSARTAN POTASSIUM 50 MG PO TABS: 50.0000 mg | ORAL_TABLET | Freq: Every day | ORAL | Status: DC

## 2013-10-27 NOTE — Addendum Note (Signed)
Addended by: Susie Cassette MD, Germain Osgood on: 10/27/2013 09:56 AM   Modules accepted: Orders, Medications

## 2013-10-27 NOTE — Progress Notes (Signed)
Patient ID: Mariah Martinez, female   DOB: June 11, 1956, 57 y.o.   MRN: [id number]   CC:  HPI: This  57 year old female, morbidly obese with a BMI of 40, hypertension presents with 2 complaints. She complains of left sided thoracic paraspinal muscle pain. This has been present for about a month it is without radiation. She does not remember how the pain started. No history of trauma. No numbness and tingling in her legs, no stool or urinary incontinence. She has tried over-the-counter ibuprofen for this  She also complains of a cough due to lisinopril for the last couple of months   No Known Allergies Past Medical History  Diagnosis Date  . Hypertension   . Hyperlipidemia   . Arthritis   . Fibromyalgia    Current Outpatient Prescriptions on File Prior to Visit  Medication Sig Dispense Refill  . aspirin 81 MG tablet Take 81 mg by mouth daily.        . cholecalciferol (VITAMIN D) 1000 UNITS tablet Take 1,000 Units by mouth daily.      Marland Kitchen doxepin (SINEQUAN) 25 MG capsule Take 25 mg by mouth. 1 or 2 pills qhs prn      . gabapentin (NEURONTIN) 100 MG capsule Take 1 capsule (100 mg total) by mouth 2 (two) times daily.  60 capsule  3  . labetalol (NORMODYNE) 100 MG tablet Take 1 tablet (100 mg total) by mouth daily.  30 tablet  1  . simvastatin (ZOCOR) 20 MG tablet Take 0.5 tablets (10 mg total) by mouth at bedtime.  90 tablet  3  . vitamin E 600 UNIT capsule Take 600 Units by mouth daily.       No current facility-administered medications on file prior to visit.   Family History  Problem Relation Age of Onset  . Colon cancer Neg Hx   . Colon polyps Neg Hx   . Stomach cancer Neg Hx    History   Social History  . Marital Status: Single    Spouse Name: N/A    Number of Children: N/A  . Years of Education: N/A   Occupational History  . Not on file.   Social History Main Topics  . Smoking status: Current Every Day Smoker -- 0.50 packs/day for 20 years    Types: Cigarettes  .  Smokeless tobacco: Never Used  . Alcohol Use: 0.6 oz/week    1 Cans of beer per week     Comment: Monthly.  . Drug Use: No  . Sexual Activity: Not on file   Other Topics Concern  . Not on file   Social History Narrative  . No narrative on file    Review of Systems  Constitutional: Negative for fever, chills, diaphoresis, activity change, appetite change and fatigue.  HENT: Negative for ear pain, nosebleeds, congestion, facial swelling, rhinorrhea, neck pain, neck stiffness and ear discharge.   Eyes: Negative for pain, discharge, redness, itching and visual disturbance.  Respiratory: Negative for cough, choking, chest tightness, shortness of breath, wheezing and stridor.   Cardiovascular: Negative for chest pain, palpitations and leg swelling.  Gastrointestinal: Negative for abdominal distention.  Genitourinary: Negative for dysuria, urgency, frequency, hematuria, flank pain, decreased urine volume, difficulty urinating and dyspareunia.  Musculoskeletal: Positive for back pain, joint swelling, arthralgias and gait problem.  Neurological: Negative for dizziness, tremors, seizures, syncope, facial asymmetry, speech difficulty, weakness, light-headedness, numbness and headaches.  Hematological: Negative for adenopathy. Does not bruise/bleed easily.  Psychiatric/Behavioral: Negative for hallucinations, behavioral problems, confusion, dysphoric  mood, decreased concentration and agitation.    Objective:   Filed Vitals:   10/27/13 0901  BP: 153/104  Pulse: 74  Temp: 98.7 F (37.1 C)  Resp: 14    Physical Exam  Constitutional: Appears well-developed and well-nourished. No distress.  HENT: Normocephalic. External right and left ear normal. Oropharynx is clear and moist.  Eyes: Conjunctivae and EOM are normal. PERRLA, no scleral icterus.  Neck: Normal ROM. Neck supple. No JVD. No tracheal deviation. No thyromegaly.  CVS: RRR, S1/S2 +, no murmurs, no gallops, no carotid bruit.   Pulmonary: Effort and breath sounds normal, no stridor, rhonchi, wheezes, rales.  Abdominal: Soft. BS +,  no distension, tenderness, rebound or guarding.  Musculoskeletal: Tenderness to palpation in the left paraspinal muscles. . No edema and no tenderness.  Lymphadenopathy: No lymphadenopathy noted, cervical, inguinal. Neuro: Alert. Normal reflexes, muscle tone coordination. No cranial nerve deficit. Skin: Skin is warm and dry. No rash noted. Not diaphoretic. No erythema. No pallor.  Psychiatric: Normal mood and affect. Behavior, judgment, thought content normal.   Lab Results  Component Value Date   WBC 16.4* 03/08/2013   HGB 12.7 03/08/2013   HCT 38.5 03/08/2013   MCV 77.3* 03/08/2013   PLT 336 03/08/2013   Lab Results  Component Value Date   CREATININE 0.90 07/19/2013   BUN 17 07/19/2013   NA 139 07/19/2013   K 3.8 07/19/2013   CL 107 07/19/2013   CO2 24 07/19/2013    Lab Results  Component Value Date   HGBA1C TEST NOT PERFORMED 08/11/2013   Lipid Panel     Component Value Date/Time   CHOL 279* 08/11/2013 1425   TRIG 138 08/11/2013 1425   HDL 44 08/11/2013 1425   CHOLHDL 6.3 08/11/2013 1425   VLDL 28 08/11/2013 1425   LDLCALC 207* 08/11/2013 1425       Assessment and plan:   Patient Active Problem List   Diagnosis Date Noted  . Blood in stool 07/10/2011  . 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 Segmental colitis 07/10/2011  . Diverticulosis of colon (without mention of hemorrhage) 07/10/2011   Back pain, given history of nicotine dependence will and show no pathologic fracture, no stress fracture no disc prolapse We'll obtain MRI of the lumbar and thoracic spine Flexeril Ibuprofen Followup in 2 weeks   Hypertension Due to cough will DC lisinopril and switch her losartan     The patient was given clear instructions to go to ER or return to medical center if symptoms don't improve, worsen or new problems develop. The patient verbalized understanding. The patient was told to  call to get any lab results if not heard anything in the next week.

## 2013-10-27 NOTE — Addendum Note (Signed)
Addended by: Susie Cassette MD, Germain Osgood on: 10/27/2013 09:39 AM   Modules accepted: Orders

## 2013-10-27 NOTE — Progress Notes (Signed)
Pt is here for pain on Lt side towards back x1 month; pain scale on a level 7 today. Experiencing a cough x2 months. Pt feels that Lisinopril may be causing the cough. Requests to switch BP medication.

## 2013-10-28 ENCOUNTER — Telehealth: Payer: Self-pay | Admitting: *Deleted

## 2013-10-28 NOTE — Telephone Encounter (Signed)
Contacted pt to inform her of her lab results. Left a voicemail for the pt to call back.

## 2013-10-29 ENCOUNTER — Telehealth: Payer: Self-pay | Admitting: Oncology

## 2013-10-29 ENCOUNTER — Telehealth: Payer: Self-pay | Admitting: *Deleted

## 2013-10-29 NOTE — Telephone Encounter (Signed)
Returned phone call to notify patient that labs are normal with the exception of WBC count which is mildly elevated at 14,000. Given previously elevated WBC count the patient has been referred to hematology oncology. Dr. Susie Cassette requests that pt returns to clinic on Monday 11/01/13 for further explanation of lab result. Pt agreed. Call completed effectively.

## 2013-10-29 NOTE — Telephone Encounter (Signed)
C/D 10/29/13 for appt. 11/18/13

## 2013-10-29 NOTE — Telephone Encounter (Signed)
S/W PT AND GVE NP APPT 01/08 @ 10:30 W/DR. SHADAD REFERRING DR. Richarda Overlie DX- LEUKOCYTOSIS WELCOME PACKET MAILED.

## 2013-11-08 ENCOUNTER — Ambulatory Visit (HOSPITAL_COMMUNITY): Admission: RE | Admit: 2013-11-08 | Payer: Self-pay | Source: Ambulatory Visit

## 2013-11-12 ENCOUNTER — Other Ambulatory Visit: Payer: Self-pay | Admitting: Oncology

## 2013-11-12 DIAGNOSIS — D72829 Elevated white blood cell count, unspecified: Secondary | ICD-10-CM

## 2013-11-17 ENCOUNTER — Ambulatory Visit: Payer: 59 | Attending: Internal Medicine

## 2013-11-18 ENCOUNTER — Ambulatory Visit (HOSPITAL_BASED_OUTPATIENT_CLINIC_OR_DEPARTMENT_OTHER): Payer: No Typology Code available for payment source | Admitting: Oncology

## 2013-11-18 ENCOUNTER — Other Ambulatory Visit (HOSPITAL_BASED_OUTPATIENT_CLINIC_OR_DEPARTMENT_OTHER): Payer: No Typology Code available for payment source

## 2013-11-18 ENCOUNTER — Encounter: Payer: Self-pay | Admitting: Oncology

## 2013-11-18 ENCOUNTER — Ambulatory Visit: Payer: 59

## 2013-11-18 VITALS — BP 152/96 | HR 87 | Temp 97.5°F | Resp 18 | Ht 68.0 in | Wt 271.5 lb

## 2013-11-18 DIAGNOSIS — D72829 Elevated white blood cell count, unspecified: Secondary | ICD-10-CM

## 2013-11-18 DIAGNOSIS — F17200 Nicotine dependence, unspecified, uncomplicated: Secondary | ICD-10-CM

## 2013-11-18 DIAGNOSIS — I10 Essential (primary) hypertension: Secondary | ICD-10-CM

## 2013-11-18 LAB — CBC WITH DIFFERENTIAL/PLATELET
BASO%: 0.8 % (ref 0.0–2.0)
BASOS ABS: 0.1 10*3/uL (ref 0.0–0.1)
EOS ABS: 0.3 10*3/uL (ref 0.0–0.5)
EOS%: 1.6 % (ref 0.0–7.0)
HEMATOCRIT: 41 % (ref 34.8–46.6)
HEMOGLOBIN: 13 g/dL (ref 11.6–15.9)
LYMPH%: 23.3 % (ref 14.0–49.7)
MCH: 26.1 pg (ref 25.1–34.0)
MCHC: 31.8 g/dL (ref 31.5–36.0)
MCV: 82.2 fL (ref 79.5–101.0)
MONO#: 0.7 10*3/uL (ref 0.1–0.9)
MONO%: 4.4 % (ref 0.0–14.0)
NEUT%: 69.9 % (ref 38.4–76.8)
NEUTROS ABS: 11.2 10*3/uL — AB (ref 1.5–6.5)
Platelets: 294 10*3/uL (ref 145–400)
RBC: 4.99 10*6/uL (ref 3.70–5.45)
RDW: 18.6 % — AB (ref 11.2–14.5)
WBC: 16 10*3/uL — ABNORMAL HIGH (ref 3.9–10.3)
lymph#: 3.7 10*3/uL — ABNORMAL HIGH (ref 0.9–3.3)

## 2013-11-18 LAB — COMPREHENSIVE METABOLIC PANEL (CC13)
ALBUMIN: 3.2 g/dL — AB (ref 3.5–5.0)
ALK PHOS: 138 U/L (ref 40–150)
ALT: 21 U/L (ref 0–55)
AST: 16 U/L (ref 5–34)
Anion Gap: 9 mEq/L (ref 3–11)
BUN: 16.5 mg/dL (ref 7.0–26.0)
CALCIUM: 9.5 mg/dL (ref 8.4–10.4)
CHLORIDE: 108 meq/L (ref 98–109)
CO2: 24 mEq/L (ref 22–29)
CREATININE: 0.8 mg/dL (ref 0.6–1.1)
GLUCOSE: 146 mg/dL — AB (ref 70–140)
POTASSIUM: 4.3 meq/L (ref 3.5–5.1)
Sodium: 142 mEq/L (ref 136–145)
Total Bilirubin: 0.37 mg/dL (ref 0.20–1.20)
Total Protein: 7.7 g/dL (ref 6.4–8.3)

## 2013-11-18 LAB — CHCC SMEAR

## 2013-11-18 NOTE — Consult Note (Signed)
Reason for Referral: Leukocytosis.  HPI: This is a pleasant 58 year old woman native of [place] currently living in [place] for the last 8 years. She is a rather healthy woman with a past medical history significant for hypertension and fibromyalgia. She was referred to me for the evaluation of leukocytosis with normal differential. Her most recent CBC showed a total white cell count of 14,000 with a normal hemoglobin and platelets. Her differential indicated a normal neutrophil, lymphocyte, eosinophils and  Monocyte percentages. She is completely asymptomatic from this and have been told that she had an elevated white cell count when she had a cholecystectomy in April of 2014. Looking back at previous white cell counts her white cell count have been fluctuating as high as 22,000 and the last 5-6 years. Clinically, she reports no new symptoms. She is not reporting any constitutional symptoms of weight loss or appetite changes. Has not reported any lymphadenopathy or shortness of breath. Is not reporting any abdominal pain or early satiety. Has not reported any changes in her bowel habits. She's continued to work full time without any hindrance or decline. She continued to smoke and have cut down on smoking some but still smokes less than a pack a day and have done that for years. She also been told that she has fibromyalgia chronic arthritis and she does have diffuse pain.   Past Medical History  Diagnosis Date  . Hypertension   . Hyperlipidemia   . Arthritis   . Fibromyalgia   :  Past Surgical History  Procedure Laterality Date  . Cholecystectomy N/A 03/01/2013    Procedure: LAPAROSCOPIC CHOLECYSTECTOMY;  Surgeon: Gwenyth Ober, MD;  Location: [facility];  Service: General;  Laterality: N/A;  :   Current Outpatient Prescriptions  Medication Sig Dispense Refill  . aspirin 81 MG tablet Take 81 mg by mouth daily.        . cyclobenzaprine (FLEXERIL) 5 MG tablet Take 1 tablet (5 mg total)  by mouth 2 (two) times daily as needed for muscle spasms (Avoid driving after taking this medication).  60 tablet  0  . hydrochlorothiazide (MICROZIDE) 12.5 MG capsule Take 1 capsule (12.5 mg total) by mouth daily.  60 capsule  2  . ibuprofen (ADVIL,MOTRIN) 600 MG tablet Take 1 tablet (600 mg total) by mouth every 8 (eight) hours as needed.  30 tablet  0  . losartan (COZAAR) 50 MG tablet Take 1 tablet (50 mg total) by mouth daily.  90 tablet  3  . vitamin E 600 UNIT capsule Take 600 Units by mouth daily.       No current facility-administered medications for this visit.      No Known Allergies:  Family History  Problem Relation Age of Onset  . Colon cancer Neg Hx   . Colon polyps Neg Hx   . Stomach cancer Neg Hx   :  History   Social History  . Marital Status: Single    Spouse Name: N/A    Number of Children: N/A  . Years of Education: N/A   Occupational History  . Not on file.   Social History Main Topics  . Smoking status: Current Every Day Smoker -- 0.50 packs/day for 20 years    Types: Cigarettes  . Smokeless tobacco: Never Used  . Alcohol Use: 0.6 oz/week    1 Cans of beer per week     Comment: Monthly.  . Drug Use: No  . Sexual Activity: Not on file  Other Topics Concern  . Not on file   Social History Narrative  . No narrative on file  :  Constitutional: negative for anorexia, chills and fatigue Eyes: negative for icterus, irritation and redness Ears, nose, mouth, throat, and face: negative for epistaxis, hoarseness and nasal congestion Respiratory: negative for cough, dyspnea on exertion and emphysema Cardiovascular: negative for chest pain, dyspnea, fatigue, orthopnea and palpitations Gastrointestinal: negative for abdominal pain, constipation, melena, nausea and reflux symptoms Genitourinary:negative for hematuria, hesitancy and nocturia Integument/breast: negative for dryness, pruritus and rash Hematologic/lymphatic: negative for bleeding, easy  bruising and lymphadenopathy Musculoskeletal:negative for arthralgias, back pain and bone pain Neurological: negative for headaches, seizures and vertigo  Exam: ECOG 0 Blood pressure 152/96, pulse 87, temperature 97.5 F (36.4 C), temperature source Oral, resp. rate 18, height _0  (1.727 m), weight 271 lb 8 oz (123.152 kg), last menstrual period 09/27/2013. General appearance: alert, cooperative and appears stated age Head: Normocephalic, without obvious abnormality, atraumatic Throat: lips, mucosa, and tongue normal; teeth and gums normal Neck: no adenopathy, no carotid bruit, no JVD, supple, symmetrical, trachea midline and thyroid not enlarged, symmetric, no tenderness/mass/nodules Back: negative, symmetric, no curvature. ROM normal. No CVA tenderness. Resp: clear to auscultation bilaterally Chest wall: no tenderness Cardio: regular rate and rhythm, S1, S2 normal, no murmur, click, rub or gallop GI: soft, non-tender; bowel sounds normal; no masses,  no organomegaly Extremities: extremities normal, atraumatic, no cyanosis or edema Pulses: 2+ and symmetric Skin: Skin color, texture, turgor normal. No rashes or lesions Lymph nodes: Cervical, supraclavicular, and axillary nodes normal. Neurologic: Grossly normal   Recent Labs  11/18/13 1033  WBC 16.0*  HGB 13.0  HCT 41.0  PLT 294    Recent Labs  11/18/13 1033  NA 142  K 4.3  CO2 24  GLUCOSE 146*  BUN 16.5  CREATININE 0.8  CALCIUM 9.5     Blood smear review: Peripheral smear was personally reviewed today and showed leukocytosis without any immature or blasts cells. Could not appreciate any abnormalities in the lymphocytes, red cells or platelets.  Assessment and Plan:   58 year old woman with the following issues:  1. Leukocytosis with a normal differential. The differential diagnosis was discussed today with the patient in detail. Primary hematological conditions are unlikely at this point. I doubt that there is  evidence of myeloproliferative disorder or acute leukemia. Secondary or reactive process as the most likely etiology. These include chronic inflammatory conditions, fibromyalgia, or recurrent sinusitis. It is clear to me that her white cell count fluctuated in the last 5 or so years as high as 22,000 and returns close to baseline. I see nose suggestions of malignancy at this point to cause this leukocytosis. Smoking could also be contributing factor at this point. In terms of recommendations, I do not recommend further testing or a bone marrow biopsy but continued observation at this time. She develops any other abnormalities in her blood including an abnormal differential or abnormalities in her hemoglobin or platelets then we can reconsider the workup.  2. Smoking cessation: I have counseled her today about the importance of stopping smoking special in the setting of leukocytosis in addition to the other problems associated with smoking.  Followup will be as needed and will be happy to see her in the future if any other problems arise.

## 2013-11-18 NOTE — Progress Notes (Signed)
Please see consult note.  

## 2013-11-18 NOTE — Progress Notes (Signed)
Checked in new pt with no financial concerns. °

## 2013-11-19 ENCOUNTER — Encounter: Payer: Self-pay | Admitting: Oncology

## 2014-03-28 ENCOUNTER — Other Ambulatory Visit (HOSPITAL_COMMUNITY): Payer: Self-pay | Admitting: Nurse Practitioner

## 2014-03-28 DIAGNOSIS — Z1231 Encounter for screening mammogram for malignant neoplasm of breast: Secondary | ICD-10-CM

## 2014-03-31 ENCOUNTER — Ambulatory Visit (HOSPITAL_COMMUNITY): Payer: No Typology Code available for payment source

## 2014-04-05 ENCOUNTER — Ambulatory Visit (HOSPITAL_COMMUNITY)
Admission: RE | Admit: 2014-04-05 | Discharge: 2014-04-05 | Disposition: A | Discharge status: Home / Self Care | Payer: 59 | Source: Ambulatory Visit | Attending: Nurse Practitioner | Admitting: Nurse Practitioner

## 2014-04-05 DIAGNOSIS — Z1231 Encounter for screening mammogram for malignant neoplasm of breast: Secondary | ICD-10-CM

## 2014-04-06 ENCOUNTER — Other Ambulatory Visit: Payer: Self-pay | Admitting: Nurse Practitioner

## 2014-04-06 DIAGNOSIS — R928 Other abnormal and inconclusive findings on diagnostic imaging of breast: Secondary | ICD-10-CM

## 2014-04-18 ENCOUNTER — Other Ambulatory Visit: Payer: Self-pay | Admitting: Nurse Practitioner

## 2014-04-18 ENCOUNTER — Ambulatory Visit
Admission: RE | Admit: 2014-04-18 | Discharge: 2014-04-18 | Disposition: A | Discharge status: Home / Self Care | Payer: 59 | Source: Ambulatory Visit | Attending: Nurse Practitioner | Admitting: Nurse Practitioner

## 2014-04-18 DIAGNOSIS — R928 Other abnormal and inconclusive findings on diagnostic imaging of breast: Secondary | ICD-10-CM

## 2014-04-26 ENCOUNTER — Ambulatory Visit
Admission: RE | Admit: 2014-04-26 | Discharge: 2014-04-26 | Disposition: A | Discharge status: Home / Self Care | Payer: 59 | Source: Ambulatory Visit | Attending: Nurse Practitioner | Admitting: Nurse Practitioner

## 2014-04-26 ENCOUNTER — Other Ambulatory Visit: Payer: Self-pay | Admitting: Nurse Practitioner

## 2014-04-26 DIAGNOSIS — R928 Other abnormal and inconclusive findings on diagnostic imaging of breast: Secondary | ICD-10-CM

## 2014-08-10 ENCOUNTER — Encounter (HOSPITAL_COMMUNITY): Payer: Self-pay | Admitting: Emergency Medicine

## 2014-08-10 ENCOUNTER — Emergency Department (HOSPITAL_COMMUNITY): Payer: 59

## 2014-08-10 ENCOUNTER — Emergency Department (HOSPITAL_COMMUNITY)
Admission: EM | Admit: 2014-08-10 | Discharge: 2014-08-10 | Disposition: A | Discharge status: Home / Self Care | Payer: 59 | Attending: Emergency Medicine | Admitting: Emergency Medicine

## 2014-08-10 DIAGNOSIS — R1011 Right upper quadrant pain: Secondary | ICD-10-CM

## 2014-08-10 DIAGNOSIS — F17200 Nicotine dependence, unspecified, uncomplicated: Secondary | ICD-10-CM | POA: Diagnosis not present

## 2014-08-10 DIAGNOSIS — T39395A Adverse effect of other nonsteroidal anti-inflammatory drugs [NSAID], initial encounter: Secondary | ICD-10-CM

## 2014-08-10 DIAGNOSIS — Z79899 Other long term (current) drug therapy: Secondary | ICD-10-CM | POA: Diagnosis not present

## 2014-08-10 DIAGNOSIS — M129 Arthropathy, unspecified: Secondary | ICD-10-CM | POA: Insufficient documentation

## 2014-08-10 DIAGNOSIS — T394X5A Adverse effect of antirheumatics, not elsewhere classified, initial encounter: Secondary | ICD-10-CM | POA: Diagnosis not present

## 2014-08-10 DIAGNOSIS — IMO0001 Reserved for inherently not codable concepts without codable children: Secondary | ICD-10-CM | POA: Insufficient documentation

## 2014-08-10 DIAGNOSIS — Z8639 Personal history of other endocrine, nutritional and metabolic disease: Secondary | ICD-10-CM | POA: Insufficient documentation

## 2014-08-10 DIAGNOSIS — Z862 Personal history of diseases of the blood and blood-forming organs and certain disorders involving the immune mechanism: Secondary | ICD-10-CM | POA: Diagnosis not present

## 2014-08-10 DIAGNOSIS — Z7982 Long term (current) use of aspirin: Secondary | ICD-10-CM | POA: Diagnosis not present

## 2014-08-10 DIAGNOSIS — R109 Unspecified abdominal pain: Secondary | ICD-10-CM | POA: Insufficient documentation

## 2014-08-10 DIAGNOSIS — K2960 Other gastritis without bleeding: Secondary | ICD-10-CM | POA: Diagnosis not present

## 2014-08-10 DIAGNOSIS — T50995A Adverse effect of other drugs, medicaments and biological substances, initial encounter: Secondary | ICD-10-CM | POA: Diagnosis not present

## 2014-08-10 DIAGNOSIS — R1084 Generalized abdominal pain: Secondary | ICD-10-CM

## 2014-08-10 DIAGNOSIS — I10 Essential (primary) hypertension: Secondary | ICD-10-CM | POA: Diagnosis not present

## 2014-08-10 LAB — URINALYSIS, ROUTINE W REFLEX MICROSCOPIC
Glucose, UA: NEGATIVE mg/dL
Ketones, ur: NEGATIVE mg/dL
NITRITE: NEGATIVE
PH: 5.5 (ref 5.0–8.0)
Protein, ur: 100 mg/dL — AB
SPECIFIC GRAVITY, URINE: 1.022 (ref 1.005–1.030)
Urobilinogen, UA: 1 mg/dL (ref 0.0–1.0)

## 2014-08-10 LAB — CBC WITH DIFFERENTIAL/PLATELET
BASOS PCT: 0 % (ref 0–1)
Basophils Absolute: 0 10*3/uL (ref 0.0–0.1)
Eosinophils Absolute: 0.2 10*3/uL (ref 0.0–0.7)
Eosinophils Relative: 1 % (ref 0–5)
HCT: 43 % (ref 36.0–46.0)
Hemoglobin: 14.1 g/dL (ref 12.0–15.0)
LYMPHS ABS: 5.4 10*3/uL — AB (ref 0.7–4.0)
Lymphocytes Relative: 31 % (ref 12–46)
MCH: 27 pg (ref 26.0–34.0)
MCHC: 32.8 g/dL (ref 30.0–36.0)
MCV: 82.4 fL (ref 78.0–100.0)
Monocytes Absolute: 1 10*3/uL (ref 0.1–1.0)
Monocytes Relative: 6 % (ref 3–12)
NEUTROS PCT: 62 % (ref 43–77)
Neutro Abs: 11 10*3/uL — ABNORMAL HIGH (ref 1.7–7.7)
PLATELETS: 380 10*3/uL (ref 150–400)
RBC: 5.22 MIL/uL — AB (ref 3.87–5.11)
RDW: 16.6 % — ABNORMAL HIGH (ref 11.5–15.5)
WBC: 17.6 10*3/uL — ABNORMAL HIGH (ref 4.0–10.5)

## 2014-08-10 LAB — COMPREHENSIVE METABOLIC PANEL
ALBUMIN: 3.6 g/dL (ref 3.5–5.2)
ALK PHOS: 133 U/L — AB (ref 39–117)
ALT: 22 U/L (ref 0–35)
AST: 19 U/L (ref 0–37)
Anion gap: 17 — ABNORMAL HIGH (ref 5–15)
BUN: 17 mg/dL (ref 6–23)
CO2: 21 mEq/L (ref 19–32)
Calcium: 10.6 mg/dL — ABNORMAL HIGH (ref 8.4–10.5)
Chloride: 102 mEq/L (ref 96–112)
Creatinine, Ser: 1.23 mg/dL — ABNORMAL HIGH (ref 0.50–1.10)
GFR calc Af Amer: 55 mL/min — ABNORMAL LOW (ref >90)
GFR calc non Af Amer: 47 mL/min — ABNORMAL LOW (ref >90)
GLUCOSE: 104 mg/dL — AB (ref 70–99)
POTASSIUM: 4.1 meq/L (ref 3.7–5.3)
SODIUM: 140 meq/L (ref 137–147)
TOTAL PROTEIN: 8.9 g/dL — AB (ref 6.0–8.3)
Total Bilirubin: 0.4 mg/dL (ref 0.3–1.2)

## 2014-08-10 LAB — LIPASE, BLOOD: Lipase: 71 U/L — ABNORMAL HIGH (ref 11–59)

## 2014-08-10 LAB — URINE MICROSCOPIC-ADD ON

## 2014-08-10 LAB — I-STAT TROPONIN, ED: Troponin i, poc: 0.01 ng/mL (ref 0.00–0.08)

## 2014-08-10 MED ORDER — OMEPRAZOLE 20 MG PO CPDR: 20.0000 mg | DELAYED_RELEASE_CAPSULE | Freq: Every day | ORAL | Status: DC

## 2014-08-10 MED ORDER — HYDROCODONE-ACETAMINOPHEN 5-325 MG PO TABS: 1.0000 | ORAL_TABLET | Freq: Four times a day (QID) | ORAL | Status: DC | PRN

## 2014-08-10 MED ORDER — MORPHINE SULFATE 4 MG/ML IJ SOLN
4.0000 mg | Freq: Once | INTRAMUSCULAR | Status: AC
  Administered 2014-08-10: 4 mg via INTRAVENOUS
  Filled 2014-08-10: qty 1

## 2014-08-10 MED ORDER — ONDANSETRON HCL 4 MG/2ML IJ SOLN
4.0000 mg | Freq: Once | INTRAMUSCULAR | Status: AC
  Filled 2014-08-10: qty 2

## 2014-08-10 MED ORDER — SODIUM CHLORIDE 0.9 % IV BOLUS (SEPSIS)
1000.0000 mL | Freq: Once | INTRAVENOUS | Status: AC
  Administered 2014-08-10: 1000 mL via INTRAVENOUS

## 2014-08-10 NOTE — ED Provider Notes (Signed)
CSN: [id number]     Arrival date & time 08/10/14  1411 History   First MD Initiated Contact with Patient 08/10/14 1543     Chief Complaint  Patient presents with  . Abdominal Pain     (Consider location/radiation/quality/duration/timing/severity/associated sxs/prior Treatment) Patient is a 58 y.o. female presenting with abdominal pain. The history is provided by the patient and medical records. No language interpreter was used.  Abdominal Pain Associated symptoms: no chest pain, no constipation, no cough, no diarrhea, no dysuria, no fatigue, no fever, no hematuria, no nausea, no shortness of breath and no vomiting     Mariah Martinez is a 58 y.o. female  with a hx of HTN, hyperlipidemia, arthritis and fibromyalgia presents to the Emergency Department complaining of gradual, persistent, progressively worsening upper abdominal "spasms" onset 6 months ago worsening 2 months ago.  Pt reports that until 8 weeks ago, she had these episodes only 1x per months, but the frequency has been increasing and is now occuring 3x per day.  She is taking flexeril for the pain without significant improvement.  Pt reports cholecystectomy in April 2014.  She reports the current pain is almost identical to her gallbladder pain before it was removed.  Pt reports pain does not change with food intake.  She has been taking ibuprofen with some improvement as well, but now had increased GERD symptoms in the last few weeks after the increase in ibuprofen.  Pt denies fever, chills, headache, neck pain, chest pain, SOB, N/V/D, weakness, dizziness, syncope, dysuria, hematuria.     Past Medical History  Diagnosis Date  . Hypertension   . Hyperlipidemia   . Arthritis   . Fibromyalgia    Past Surgical History  Procedure Laterality Date  . Cholecystectomy N/A 03/01/2013    Procedure: LAPAROSCOPIC CHOLECYSTECTOMY;  Surgeon: Gwenyth Ober, MD;  Location: [facility];  Service: General;  Laterality: N/A;   Family History   Problem Relation Age of Onset  . Colon cancer Neg Hx   . Colon polyps Neg Hx   . Stomach cancer Neg Hx    History  Substance Use Topics  . Smoking status: Current Every Day Smoker -- 0.50 packs/day for 20 years    Types: Cigarettes  . Smokeless tobacco: Never Used  . Alcohol Use: 0.6 oz/week    1 Cans of beer per week     Comment: Monthly.   OB History   Grav Para Term Preterm Abortions TAB SAB Ect Mult Living                 Review of Systems  Constitutional: Negative for fever, diaphoresis, appetite change, fatigue and unexpected weight change.  HENT: Negative for mouth sores and trouble swallowing.   Respiratory: Negative for cough, chest tightness, shortness of breath, wheezing and stridor.   Cardiovascular: Negative for chest pain and palpitations.  Gastrointestinal: Positive for abdominal pain. Negative for nausea, vomiting, diarrhea, constipation, blood in stool, abdominal distention and rectal pain.  Genitourinary: Negative for dysuria, urgency, frequency, hematuria, flank pain and difficulty urinating.  Musculoskeletal: Negative for back pain, neck pain and neck stiffness.  Skin: Negative for rash.  Neurological: Negative for weakness.  Hematological: Negative for adenopathy.  Psychiatric/Behavioral: Negative for confusion.  All other systems reviewed and are negative.     Allergies  Review of patient's allergies indicates no known allergies.  Home Medications   Prior to Admission medications   Medication Sig Start Date End Date Taking? Authorizing Provider  aspirin 81 MG  tablet Take 81 mg by mouth daily.     Yes Historical Provider, MD  CHONDROITIN SULFATE PO Take 1 tablet by mouth daily.   Yes Historical Provider, MD  cyclobenzaprine (FLEXERIL) 5 MG tablet Take 1 tablet (5 mg total) by mouth 2 (two) times daily as needed for muscle spasms (Avoid driving after taking this medication). 10/27/13  Yes Reyne Dumas, MD  hydrochlorothiazide (MICROZIDE) 12.5 MG  capsule Take 1 capsule (12.5 mg total) by mouth daily. 10/27/13  Yes Reyne Dumas, MD  ibuprofen (ADVIL,MOTRIN) 800 MG tablet Take 800 mg by mouth every 8 (eight) hours as needed for moderate pain.   Yes Historical Provider, MD  Multiple Vitamins-Minerals (MULTIVITAMIN WITH MINERALS) tablet Take 1 tablet by mouth daily.   Yes Historical Provider, MD  HYDROcodone-acetaminophen (NORCO/VICODIN) 5-325 MG per tablet Take 1-2 tablets by mouth every 6 (six) hours as needed for moderate pain or severe pain. 08/10/14   Priti Consoli, PA-C  omeprazole (PRILOSEC) 20 MG capsule Take 1 capsule (20 mg total) by mouth daily. 08/10/14   Gelila Well, PA-C   BP 117/67  Pulse 87  Temp(Src) 98.1 F (36.7 C) (Oral)  Resp 16  SpO2 99%  LMP 09/27/2013 Physical Exam  Nursing note and vitals reviewed. Constitutional: She appears well-developed and well-nourished. No distress.  Awake, alert, nontoxic appearance  HENT:  Head: Normocephalic and atraumatic.  Mouth/Throat: Oropharynx is clear and moist. No oropharyngeal exudate.  Eyes: Conjunctivae are normal. No scleral icterus.  Neck: Normal range of motion. Neck supple.  Cardiovascular: Normal rate, regular rhythm and intact distal pulses.   Pulmonary/Chest: Effort normal and breath sounds normal. No respiratory distress. She has no wheezes.  Equal chest expansion  Abdominal: Soft. Normal appearance and bowel sounds are normal. She exhibits no distension and no mass. There is tenderness in the right upper quadrant. There is guarding ( voluntary). There is no rigidity, no rebound and no CVA tenderness.  Obese TTP with mild voluntary guarding in the RUQ.   No CVA tenderness  Musculoskeletal: Normal range of motion. She exhibits no edema.  Neurological: She is alert.  Speech is clear and goal oriented Moves extremities without ataxia  Skin: Skin is warm and dry. She is not diaphoretic.  Psychiatric: She has a normal mood and affect.    ED Course   Procedures (including critical care time) Labs Review Labs Reviewed  CBC WITH DIFFERENTIAL - Abnormal; Notable for the following:    WBC 17.6 (*)    RBC 5.22 (*)    RDW 16.6 (*)    Neutro Abs 11.0 (*)    Lymphs Abs 5.4 (*)    All other components within normal limits  COMPREHENSIVE METABOLIC PANEL - Abnormal; Notable for the following:    Glucose, Bld 104 (*)    Creatinine, Ser 1.23 (*)    Calcium 10.6 (*)    Total Protein 8.9 (*)    Alkaline Phosphatase 133 (*)    GFR calc non Af Amer 47 (*)    GFR calc Af Amer 55 (*)    Anion gap 17 (*)    All other components within normal limits  LIPASE, BLOOD - Abnormal; Notable for the following:    Lipase 71 (*)    All other components within normal limits  URINALYSIS, ROUTINE W REFLEX MICROSCOPIC - Abnormal; Notable for the following:    Color, Urine AMBER (*)    APPearance CLOUDY (*)    Hgb urine dipstick SMALL (*)    Bilirubin Urine  SMALL (*)    Protein, ur 100 (*)    Leukocytes, UA SMALL (*)    All other components within normal limits  URINE MICROSCOPIC-ADD ON - Abnormal; Notable for the following:    Squamous Epithelial / LPF FEW (*)    Casts HYALINE CASTS (*)    All other components within normal limits  I-STAT TROPOININ, ED    Imaging Review US Abdomen Complete  08/10/2014   CLINICAL DATA:  Right upper quadrant abdominal pain. Elevated lipase.  EXAM: ULTRASOUND ABDOMEN COMPLETE  COMPARISON:  02/28/2013  FINDINGS: Gallbladder:  Surgically absent  Common bile duct:  Diameter: 6 mm  Liver:  Echogenic liver with poor sonic penetration compatible with diffuse hepatic steatosis. Portal hepatopetal flow noted.  IVC:  No abnormality visualized.  Pancreas:  Not seen due to overlying bowel gas.  Spleen:  Size and appearance within normal limits.  Right Kidney:  Length: 11.3 cm. Echogenicity within normal limits. No mass or hydronephrosis visualized.  Left Kidney:  Length: 11.5 cm. Echogenicity within normal limits. No mass or  hydronephrosis visualized.  Abdominal aorta:  No aneurysm visualized.  Other findings:  None.  IMPRESSION: 1. Diffuse hepatic steatosis.  Gallbladder surgically absent. 2. Nonvisualization of the pancreas due to overlying bowel gas. 3. Otherwise negative exam.   Electronically Signed   By: Sherryl Barters M.D.   On: 08/10/2014 17:00     EKG Interpretation None      MDM   Final diagnoses:  RUQ abdominal pain  Abdominal pain, colicky  Gastritis due to nonsteroidal anti-inflammatory drug (NSAID)   [name] presents with RUQ abd pain worsening in the last 6 mos now occuring 3x per day.  Pt with hx of cholecystectomy in 2014.  Pt refuses ECG, but requests [place] for evaluation.     5:20PM Pt with leukocytosis of 17.6  (ranging from 14-17 in the past) and elevated lipase, not far from baseline.  Other labs reassuring.  Pt's pain has been present for months.  [place] pending to r/o common duct stone.  Elevated creatinine at 1.2 today up from 0.8 without a bump in BUN; likely from mild dehydration.  Will give fluid bolus.    6:40 PM [place] without clear visualization of the pancreas, but no concern for retained stone.  Patient is nontoxic, nonseptic appearing, in no apparent distress.  No evidence of UTI, but urine culture sent as pt has leukocytes.  Patient's pain and other symptoms adequately managed in emergency department.  Fluid bolus given.  Labs, imaging and vitals reviewed.  Patient does not meet the SIRS or Sepsis criteria.  On repeat exam patient does not have a surgical abdomin and there are no peritoneal signs.  No indication of appendicitis, bowel obstruction, bowel perforation, cholecystitis, diverticulitis.  Patient discharged home with symptomatic treatment and given strict instructions for follow-up with their primary care physician and with on call gastroenterology.  I have also discussed reasons to return immediately to the ER.  Patient expresses understanding and agrees with  plan.  BP 117/67  Pulse 87  Temp(Src) 98.1 F (36.7 C) (Oral)  Resp 16  SpO2 99%  LMP 09/27/2013       Abigail Butts, PA-C 08/10/14 1841

## 2014-08-10 NOTE — ED Notes (Signed)
Pt c/o upper abd "spasms" x 2 months.  Denies any NVD.

## 2014-08-10 NOTE — ED Provider Notes (Signed)
Medical screening examination/treatment/procedure(s) were performed by non-physician practitioner and as supervising physician I was immediately available for consultation/collaboration.  Leota Jacobsen, MD 08/10/14 [phone]

## 2014-08-10 NOTE — Discharge Instructions (Signed)
1. Medications: omeprazole for your reflux symptoms; vicodin for your pain, usual home medications 2. Treatment: rest, drink plenty of fluids, stop taking ibuprofen, begin taking Miralax 3. Follow Up: Please followup with Gastroenterology in 3 days for further evaluation of your symptoms; please also follow-up with your primary doctor for discussion of your diagnoses and further evaluation after today's visit; if you do not have a primary care doctor use the resource guide provided to find one; Return to the emergency department for blood in your stool, vomiting that won't stop, high fevers or other concerning symptoms.   Abdominal Pain Many things can cause abdominal pain. Usually, abdominal pain is not caused by a disease and will improve without treatment. It can often be observed and treated at home. Your health care provider will do a physical exam and possibly order blood tests and X-rays to help determine the seriousness of your pain. However, in many cases, more time must pass before a clear cause of the pain can be found. Before that point, your health care provider may not know if you need more testing or further treatment. HOME CARE INSTRUCTIONS  Monitor your abdominal pain for any changes. The following actions may help to alleviate any discomfort you are experiencing:  Only take over-the-counter or prescription medicines as directed by your health care provider.  Do not take laxatives unless directed to do so by your health care provider.  Try a clear liquid diet (broth, tea, or water) as directed by your health care provider. Slowly move to a bland diet as tolerated. SEEK MEDICAL CARE IF:  You have unexplained abdominal pain.  You have abdominal pain associated with nausea or diarrhea.  You have pain when you urinate or have a bowel movement.  You experience abdominal pain that wakes you in the night.  You have abdominal pain that is worsened or improved by eating food.  You  have abdominal pain that is worsened with eating fatty foods.  You have a fever. SEEK IMMEDIATE MEDICAL CARE IF:   Your pain does not go away within 2 hours.  You keep throwing up (vomiting).  Your pain is felt only in portions of the abdomen, such as the right side or the left lower portion of the abdomen.  You pass bloody or black tarry stools. MAKE SURE YOU:  Understand these instructions.   Will watch your condition.   Will get help right away if you are not doing well or get worse.  Document Released: 08/07/2005 Document Revised: 11/02/2013 Document Reviewed: 07/07/2013 [facility] Patient Information 2015 [place], [place]. This information is not intended to replace advice given to you by your health care provider. Make sure you discuss any questions you have with your health care provider.    Emergency Department Resource Guide 1) Find a Doctor and Pay Out of Pocket Although you won't have to find out who is covered by your insurance plan, it is a good idea to ask around and get recommendations. You will then need to call the office and see if the doctor you have chosen will accept you as a new patient and what types of options they offer for patients who are self-pay. Some doctors offer discounts or will set up payment plans for their patients who do not have insurance, but you will need to ask so you aren't surprised when you get to your appointment.  2) Contact Your Local Health Department Not all health departments have doctors that can see patients for sick visits,  but many do, so it is worth a call to see if yours does. If you don't know where your local health department is, you can check in your phone book. The CDC also has a tool to help you locate your state's health department, and many state websites also have listings of all of their local health departments.  3) Find a [facility] If your illness is not likely to be very severe or complicated, you may want to  try a walk in clinic. These are popping up all over the country in pharmacies, drugstores, and shopping centers. They're usually staffed by nurse practitioners or physician assistants that have been trained to treat common illnesses and complaints. They're usually fairly quick and inexpensive. However, if you have serious medical issues or chronic medical problems, these are probably not your best option.  No Primary Care Doctor: - Call Health Connect at  [phone] - they can help you locate a primary care doctor that  accepts your insurance, provides certain services, etc. - Physician Referral Service- [phone]  Chronic Pain Problems: Organization         Address  Phone   Notes  [facility]  [phone] Patients need to be referred by their primary care doctor.   Medication Assistance: Organization         Address  Phone   Notes  [facility] Medication [facility] [address] [place], [place] [address] [phone] --Must be a resident of [facility] -- Must have NO insurance coverage whatsoever (no Medicaid/ Medicare, etc.) -- The pt. MUST have a primary care doctor that directs their care regularly and follows them in the community   MedAssist  [phone]   [employer]  [phone]    Agencies that provide inexpensive medical care: Organization         Address  Phone   Notes  [place]  [phone]   [facility]    [phone]   [facility] [place], [place] [address] [phone]   [place] [address], [place] [phone]   Planned Parenthood    [phone]   [facility]    [phone]   [place] and [place] [address], [place] Phone:  [phone], Fax:  [phone] Hours of Operation:  9 am - 6 pm, M-F.  Also accepts Medicaid/Medicare and self-pay.  [facility] for [place] [address], [address], [place] Phone: [phone], Fax: [phone]. Hours of Operation:  8:30 am - 5:30 pm, M-F.  Also accepts Medicaid and self-pay.  [facility] [place] [address] [place] Phone: [phone]   [place], [place], [place] [phone], Ext. [phone] Mondays & Thursdays: 7-9 AM.  First 15 patients are seen on a first come, first serve basis.    [place] Providers:  Organization         Address  Phone   Notes  [facility] [address], [place] [phone] Also accepts self-pay patients.  [place], [place]  [phone]   [place], [address], [place] [phone]   [facility] [address], [place] [phone]  Lucianne Lei [address], [place]   [phone] Only accepts [place] patients after they have their name applied to their card.   Self-Pay (no insurance) in [facility]:  Organization         Address  Phone   Notes  Sickle Cell Patients, [facility] Internal Medicine [place] [phone]   [facility] Urgent Care [place] [phone]   [facility] [place]  [place], [place], [place] [phone]   [facility]/Dr. Osei-Bonsu  [address], [place] or [address], [place] [phone] Phone number for both [place] and [place] locations is the same.  Urgent Medical and [facility] [address], [place] [phone]   [facility] [address] [place] or [address] [phone]   [facility] [address] [place] [phone], phone; [phone], fax  Sees patients 1st and 3rd Saturday of every month.  Must not qualify for public or private insurance (i.e. Medicaid, Medicare, [place] Health Choice, Veterans' Benefits)  Household income should be no more than 200% of the poverty level The clinic cannot treat you if you are pregnant or think you are pregnant  Sexually transmitted diseases are not treated at the clinic.    Dental Care: Organization         Address  Phone  Notes  [facility] Department of [facility] [place] [phone] Accepts children up to age 28 who are enrolled in [place] or [place]; pregnant women with a Medicaid card; and children who have applied for Medicaid or [place] Health Choice, but were declined, whose parents can pay a reduced fee at time of service.  [facility] Department of [facility]  [address], [place] [phone] Accepts children up to age 51 who are enrolled in [place] or [place]; pregnant women with a Medicaid card; and children who have applied for Medicaid or [place] Health Choice, but were declined, whose parents can pay a reduced fee at time of service.  [place] Adult Dental Access PROGRAM  [place] [phone] Patients are seen by appointment only. Walk-ins are not accepted. [place] will see patients 1 years of age and older. Monday - Tuesday (8am-5pm) Most Wednesdays (8:30-5pm) $30 per visit, cash only  [facility] Adult Dental Access PROGRAM  [address], [facility] [phone] Patients are seen by appointment only. Walk-ins are not accepted. [place] will see patients 26 years of age and older. One Wednesday Evening (Monthly: Volunteer Based).  $30 per visit, cash only  [place]  [phone] for adults; Children under age 6, call Graduate Pediatric Dentistry at [phone]. Children aged 70-14, please call [phone] to  request a pediatric application.  Dental services are provided in all areas of dental care including fillings, crowns and bridges, complete and partial dentures, implants, gum treatment, root canals, and extractions. Preventive care is also provided. Treatment is provided to both adults and children. Patients are selected via a lottery and there is often a waiting list.   [facility] [address], [place]  [phone] [email or web address], [place], [place] [phone], Ext.  123 Second and Fourth Thursday of each month, opens at 6:30 AM; Clinic ends at 9 AM.  Patients are seen on a first-come first-served basis, and a limited number are seen during each clinic.   [facility]  [address] Hillard Danker [place], [place] [phone]   Eligibility Requirements You must have lived in [place], [place], or [place] counties for at least the last three months.   You cannot be eligible for state or federal sponsored [employer], including [employer], [place], or [employer].   You generally cannot be eligible for healthcare insurance through your employer.    How to apply: Eligibility screenings are held every Tuesday and Wednesday afternoon from 1:00 pm until 4:00 pm. You do not need an appointment for the interview!  [facility] [address] [place], [place]   [place]  [place] Department  [place]  [phone]    Behavioral Health Resources in the Community: Intensive Outpatient Programs Organization         Address  Phone  Notes  [place]. [address] [place], [place] [phone]   [facility] Outpatient [address] [place], [place]   ADS: Alcohol & Drug Svcs [address] [place], [place]   [place] [address]  [place], [place] or [phone]   Substance Abuse Resources Organization         Address  Phone  Notes  Alcohol and Drug Services  [phone]   [place]  [phone]   [place]   Chinita Pester  [phone]   Residential & Outpatient Substance Abuse Program  [phone]   Psychological Services Organization         Address  Phone  Notes  [facility] [place]  [place]  [phone]   [place] [address], [place] or [phone]    Mobile Crisis Teams Organization         Address  Phone  Notes  [facility], Mobile Crisis Care Unit  [phone]   [facility] Psychotherapeutic Services  [address] [place], [place]   [place] [address], [place] [phone]    Self-Help/Support Groups Organization         Address  Phone             Notes  [place]. of [place] - variety of support groups  [place] Call for more information  Narcotics Anonymous (NA), [facility]aring [facility] [address], [employer]   2 meetings at this location   Special educational needs teacher         Address  Phone  Notes  [facility] Residential Treatment [place],    [place]  [phone]   [facility]  [address] [place] [id number], [place], [place]   [place], [place] [phone] Admissions: 8am-3pm M-F  Incentives Substance [place] [address],    [place], [place] [phone]   [facility] [address] Jadene Pierini [place], [place]   The [place].,  [place], [place] - Intensive Outpatient [address], [place], [place]   [facility] ([place]  [address]  [place], [place] [phone] or [phone]   [facility] Treatment Services (RTS) [address] [place], [place] Accepts Medicaid  [place] [address],  [place] [phone] Substance Abuse/Addiction Treatment   [facility] Organization         Address  Phone  Notes  [facility]  [phone]   Domenic Schwab, PhD [address] Arlis Porta [place], [place]   [phone] or [phone]   [place]   [address] [place], [place] [phone]   [address], [place], [place] [phone] Insurance/Medicaid/sponsorship through [facility] and Families [address] [place]                                    [place], [place] [phone] [place] [address] [place] [address], [place] [phone]    Dr. Adele Schilder  [phone]   [facility] Dept. 1) [address],  2) [place] 3)  [address], [place] [phone]  [phone]   [place] [phone] or [phone] (After Hours)

## 2014-08-10 NOTE — ED Notes (Signed)
Patient attempted to provide a urine sample, but was unsuccessful, will try again in 30 minutes.

## 2014-08-10 NOTE — ED Notes (Signed)
US tech at bedside

## 2014-08-12 LAB — URINE CULTURE: Colony Count: 50000

## 2014-12-01 ENCOUNTER — Ambulatory Visit: Payer: Self-pay | Attending: Internal Medicine

## 2014-12-01 ENCOUNTER — Ambulatory Visit: Payer: 59

## 2015-01-17 ENCOUNTER — Other Ambulatory Visit: Payer: Self-pay | Admitting: *Deleted

## 2015-01-17 ENCOUNTER — Ambulatory Visit: Payer: Self-pay | Attending: Family Medicine | Admitting: Family Medicine

## 2015-01-17 ENCOUNTER — Telehealth: Payer: Self-pay | Admitting: Family Medicine

## 2015-01-17 ENCOUNTER — Encounter: Payer: Self-pay | Admitting: Family Medicine

## 2015-01-17 VITALS — BP 120/85 | HR 90 | Temp 98.8°F | Resp 18 | Ht 67.0 in | Wt 256.0 lb

## 2015-01-17 DIAGNOSIS — F32A Depression, unspecified: Secondary | ICD-10-CM | POA: Insufficient documentation

## 2015-01-17 DIAGNOSIS — E785 Hyperlipidemia, unspecified: Secondary | ICD-10-CM

## 2015-01-17 DIAGNOSIS — R05 Cough: Secondary | ICD-10-CM | POA: Insufficient documentation

## 2015-01-17 DIAGNOSIS — Z720 Tobacco use: Secondary | ICD-10-CM

## 2015-01-17 DIAGNOSIS — F17200 Nicotine dependence, unspecified, uncomplicated: Secondary | ICD-10-CM | POA: Insufficient documentation

## 2015-01-17 DIAGNOSIS — F329 Major depressive disorder, single episode, unspecified: Secondary | ICD-10-CM

## 2015-01-17 DIAGNOSIS — R058 Other specified cough: Secondary | ICD-10-CM | POA: Insufficient documentation

## 2015-01-17 DIAGNOSIS — F17210 Nicotine dependence, cigarettes, uncomplicated: Secondary | ICD-10-CM | POA: Insufficient documentation

## 2015-01-17 DIAGNOSIS — R072 Precordial pain: Secondary | ICD-10-CM | POA: Insufficient documentation

## 2015-01-17 DIAGNOSIS — R0602 Shortness of breath: Secondary | ICD-10-CM | POA: Insufficient documentation

## 2015-01-17 DIAGNOSIS — R1011 Right upper quadrant pain: Secondary | ICD-10-CM | POA: Insufficient documentation

## 2015-01-17 DIAGNOSIS — H6121 Impacted cerumen, right ear: Secondary | ICD-10-CM | POA: Insufficient documentation

## 2015-01-17 DIAGNOSIS — I10 Essential (primary) hypertension: Secondary | ICD-10-CM | POA: Insufficient documentation

## 2015-01-17 DIAGNOSIS — Z6841 Body Mass Index (BMI) 40.0 and over, adult: Secondary | ICD-10-CM | POA: Insufficient documentation

## 2015-01-17 LAB — COMPLETE METABOLIC PANEL WITH GFR
ALBUMIN: 3.5 g/dL (ref 3.5–5.2)
ALT: 29 U/L (ref 0–35)
AST: 22 U/L (ref 0–37)
Alkaline Phosphatase: 131 U/L — ABNORMAL HIGH (ref 39–117)
BILIRUBIN TOTAL: 0.5 mg/dL (ref 0.2–1.2)
BUN: 14 mg/dL (ref 6–23)
CALCIUM: 9.3 mg/dL (ref 8.4–10.5)
CHLORIDE: 102 meq/L (ref 96–112)
CO2: 22 meq/L (ref 19–32)
Creat: 0.58 mg/dL (ref 0.50–1.10)
GFR, Est Non African American: >89 mL/min
GLUCOSE: 65 mg/dL — AB (ref 70–99)
Potassium: 4.3 mEq/L (ref 3.5–5.3)
SODIUM: 137 meq/L (ref 135–145)
TOTAL PROTEIN: 7.6 g/dL (ref 6.0–8.3)

## 2015-01-17 LAB — CBC
HCT: 40.4 % (ref 36.0–46.0)
Hemoglobin: 12.9 g/dL (ref 12.0–15.0)
MCH: 24.6 pg — AB (ref 26.0–34.0)
MCHC: 31.9 g/dL (ref 30.0–36.0)
MCV: 77.1 fL — ABNORMAL LOW (ref 78.0–100.0)
MPV: 10.5 fL (ref 8.6–12.4)
Platelets: 383 10*3/uL (ref 150–400)
RBC: 5.24 MIL/uL — AB (ref 3.87–5.11)
RDW: 19.5 % — ABNORMAL HIGH (ref 11.5–15.5)
WBC: 14.4 10*3/uL — AB (ref 4.0–10.5)

## 2015-01-17 LAB — LIPID PANEL
CHOL/HDL RATIO: 4.8 ratio
CHOLESTEROL: 249 mg/dL — AB (ref 0–200)
HDL: 52 mg/dL (ref >=46)
LDL Cholesterol: 165 mg/dL — ABNORMAL HIGH (ref 0–99)
TRIGLYCERIDES: 161 mg/dL — AB (ref <150)
VLDL: 32 mg/dL (ref 0–40)

## 2015-01-17 LAB — LIPASE: LIPASE: 31 U/L (ref 0–75)

## 2015-01-17 MED ORDER — CYCLOBENZAPRINE HCL 10 MG PO TABS: 10.0000 mg | ORAL_TABLET | Freq: Three times a day (TID) | ORAL | Status: DC | PRN

## 2015-01-17 MED ORDER — CARBAMIDE PEROXIDE 6.5 % OT SOLN: 5.0000 [drp] | Freq: Two times a day (BID) | OTIC | Status: DC

## 2015-01-17 MED ORDER — PREDNISONE 50 MG PO TABS: 50.0000 mg | ORAL_TABLET | Freq: Every day | ORAL | Status: AC

## 2015-01-17 MED ORDER — ALBUTEROL SULFATE HFA 108 (90 BASE) MCG/ACT IN AERS: 2.0000 | INHALATION_SPRAY | Freq: Four times a day (QID) | RESPIRATORY_TRACT | Status: DC | PRN

## 2015-01-17 MED ORDER — AZITHROMYCIN 500 MG PO TABS: 500.0000 mg | ORAL_TABLET | Freq: Every day | ORAL | Status: AC

## 2015-01-17 MED ORDER — BUPROPION HCL ER (SR) 150 MG PO TB12: 150.0000 mg | ORAL_TABLET | Freq: Two times a day (BID) | ORAL | Status: DC

## 2015-01-17 MED ORDER — HYDROCHLOROTHIAZIDE 12.5 MG PO CAPS: 12.5000 mg | ORAL_CAPSULE | Freq: Every day | ORAL | Status: DC

## 2015-01-17 NOTE — Assessment & Plan Note (Signed)
1. Abdominal pains:  I suspect muscle pain given description and variable position of pain Treatment: Drink plenty of water you make also try tonic water (has quinine which helps with muscle aches).  Muscle relaxer- flexeril  Checking electrolytes and lipase (pancreas), CMP (liver)  Avoid NSAIDs due to history of colitis

## 2015-01-17 NOTE — Assessment & Plan Note (Signed)
R ear wax impacted: debrox twice daily for 5 days.

## 2015-01-17 NOTE — Assessment & Plan Note (Signed)
HTN: blood pressure at goal, continue HCTZ for now.

## 2015-01-17 NOTE — Progress Notes (Signed)
   Subjective:    Patient ID: Mariah Martinez, female    DOB: 04/04/1956, 59 y.o.   MRN: [id number] CC: f/u HTN, RUQ pain x 2 months, productive cough x 2 months  HPI  59 yo F:  1. RUQ pain: x 1 year. Cramping. Gets hard. Knot forms. Associated with SOB and nausea at times. No emesis or fever.  Takes 15 mins to resolve. Heat, tylenol and vicodin (prescribed by ortho for shoulder rotator cuff injury) helps. Occuring 3 times a week. Bowel movement are regular, no diarrhea or constipation.   2. Productive cough: x 2 months. With SOB at night and in the morning. Post-tussive emesis x a few episodes. Substernal chest pains. No fever. No known sick contacts. Smokes now 15 cigs per day. Has smoked up to 1.5 PPD x 30 years. Has no hx of inhaler use. Has not been diagnosed with COPD.   3. HTN; taking HCTZ. ROS as above. No leg swelling. No blurry vision.   4. Smoking: 15 cigs daily. Down form 1.5 PPD. Desires to quit. Having symptoms as per above.   Soc Hx: smokes 15 cigs per day Surg Hx: s/p chole Med Hx: HTN  Review of Systems As per HPI  GAD-7: score of 5. 1-1-4 and 7.     Objective:   Physical Exam BP 120/85 mmHg  Pulse 90  Temp(Src) 98.8 F (37.1 C) (Oral)  Resp 18  Ht 5\' 7"  (1.702 m)  Wt 256 lb (116.121 kg)  BMI 40.09 kg/m2  SpO2 100%  LMP 09/27/2013  Wt Readings from Last 3 Encounters:  01/17/15 256 lb (116.121 kg)  11/18/13 271 lb 8 oz (123.152 kg)  10/27/13 267 lb (121.11 kg)  General appearance: alert, cooperative, no distress and morbidly obese  Head: Normocephalic, without obvious abnormality, atraumatic Eyes: conjunctivae/corneas clear. PERRL, EOM's intact. Fundi benign. Ears: normal TM and external ear canal left ear and abnormal external canal right ear - cerumen removed by irrigation Nose: Nares normal. Septum midline. Mucosa normal. No drainage or sinus tenderness. Throat: lips, mucosa, and tongue normal; teeth and gums normal Neck: no adenopathy, supple,  symmetrical, trachea midline and thyroid not enlarged, symmetric, no tenderness/mass/nodules Lungs: clear to auscultation bilaterally Heart: regular rate and rhythm, S1, S2 normal, no murmur, click, rub or gallop Abdomen: obese, round, no masse, TTP, rebound or guarding   Ext: no edema        Assessment & Plan:

## 2015-01-17 NOTE — Assessment & Plan Note (Signed)
3. Smoking cessation: Smoking cessation support: smoking cessation hotline: 1-800-QUIT-NOW.  Smoking cessation classes are available through [facility] and Vascular Center. Call [phone] or visit our website at [email or web address] Starting bupropion: Initial: 150 mg once daily for 3 days; increase to 150 mg twice daily; take second dose before 6 pm, treatment should continue for 7 to 12 weeks (maximum dose: 300 mg daily).

## 2015-01-17 NOTE — Patient Instructions (Addendum)
Mariah Martinez,  Thank you for coming in today. It was a pleasure meeting you. I look forward to being your primary doctor.   1. Abdominal pains:  I suspect muscle pain given description and variable position of pain Treatment: Drink plenty of water you make also try tonic water (has quinine which helps with muscle aches).  Muscle relaxer- flexeril  Checking electrolytes and lipase (pancreas), CMP (liver)   2. Productive cough: I suspect COPD Plan: Chest x-ray ordered Azithromycin 500 mg daily for 5 days Prednisone 50 mg daily for 5 days with food (breakfast) Albuterol 2 puffs  Up to 4 times a day as needed for cough or shortness of breath.  Smoking cessation  3. Smoking cessation: Smoking cessation support: smoking cessation hotline: 1-800-QUIT-NOW.  Smoking cessation classes are available through [facility] and Vascular Center. Call [phone] or visit our website at [email or web address] Starting bupropion: Initial: 150 mg once daily for 3 days; increase to 150 mg twice daily; take second dose before 6 pm, treatment should continue for 7 to 12 weeks (maximum dose: 300 mg daily).   4. HTN: blood pressure at goal, continue HCTZ for now.   5. R ear wax impacted: debrox twice daily for 5 days.   F/u in 4 weeks for smoking and cough.   Dr.  Adrian Blackwater

## 2015-01-17 NOTE — Progress Notes (Signed)
Medicine refill. F/U HTN Complaining of RUQ pain and cramping  x2 month  Productive cough x44month  Hx smoke 15 cigarette per day.

## 2015-01-17 NOTE — Assessment & Plan Note (Signed)
Productive cough: I suspect COPD Plan: Chest x-ray ordered Azithromycin 500 mg daily for 5 days Prednisone 50 mg daily for 5 days with food (breakfast) Albuterol 2 puffs  Up to 4 times a day as needed for cough or shortness of breath.  Smoking cessation

## 2015-01-17 NOTE — Telephone Encounter (Signed)
Called patient to inform her that bupropion is not on the $4 list at [employer]. She has already changed the medications to [facility] pharmacy. She got them all except the flexeril which was a 10 dollar co-pay, resent flexeril, 30 tabs to lower the co-pay

## 2015-01-18 DIAGNOSIS — E785 Hyperlipidemia, unspecified: Secondary | ICD-10-CM | POA: Insufficient documentation

## 2015-01-18 MED ORDER — ATORVASTATIN CALCIUM 40 MG PO TABS: 40.0000 mg | ORAL_TABLET | Freq: Every day | ORAL | Status: DC

## 2015-01-18 NOTE — Addendum Note (Signed)
Addended by: Boykin Nearing on: 01/18/2015 01:28 PM   Modules accepted: Orders

## 2015-01-18 NOTE — Assessment & Plan Note (Signed)
Elevated cholesterol- recommend lipitor 40 mg daily to lower risk of MI and stroke.  10 yr CVD risk 12.9 %, MI statin recommended

## 2015-01-30 ENCOUNTER — Telehealth: Payer: Self-pay | Admitting: *Deleted

## 2015-01-30 NOTE — Telephone Encounter (Signed)
Pt aware of lab reuslts Rx in [place]

## 2015-01-30 NOTE — Telephone Encounter (Signed)
-----   Message from Boykin Nearing, MD sent at 01/18/2015  1:27 PM EST ----- Labs normal except Slightly elevated WBC, seems to be patient's baseline Elevated cholesterol- recommend lipitor 40 mg daily to lower risk of MI and stroke.  Normal liver function test and lipase (pancreas test)

## 2015-02-14 ENCOUNTER — Ambulatory Visit: Payer: Self-pay | Attending: Family Medicine | Admitting: Family Medicine

## 2015-02-14 ENCOUNTER — Encounter: Payer: Self-pay | Admitting: Family Medicine

## 2015-02-14 VITALS — BP 160/99 | HR 88 | Temp 97.9°F | Resp 18 | Ht 68.0 in | Wt 264.0 lb

## 2015-02-14 DIAGNOSIS — G5601 Carpal tunnel syndrome, right upper limb: Secondary | ICD-10-CM

## 2015-02-14 DIAGNOSIS — M21619 Bunion of unspecified foot: Secondary | ICD-10-CM | POA: Insufficient documentation

## 2015-02-14 DIAGNOSIS — M65332 Trigger finger, left middle finger: Secondary | ICD-10-CM

## 2015-02-14 DIAGNOSIS — Z114 Encounter for screening for human immunodeficiency virus [HIV]: Secondary | ICD-10-CM

## 2015-02-14 DIAGNOSIS — B360 Pityriasis versicolor: Secondary | ICD-10-CM

## 2015-02-14 DIAGNOSIS — Z6841 Body Mass Index (BMI) 40.0 and over, adult: Secondary | ICD-10-CM | POA: Insufficient documentation

## 2015-02-14 DIAGNOSIS — Z23 Encounter for immunization: Secondary | ICD-10-CM

## 2015-02-14 DIAGNOSIS — I10 Essential (primary) hypertension: Secondary | ICD-10-CM | POA: Insufficient documentation

## 2015-02-14 DIAGNOSIS — M2010 Hallux valgus (acquired), unspecified foot: Secondary | ICD-10-CM

## 2015-02-14 DIAGNOSIS — R21 Rash and other nonspecific skin eruption: Secondary | ICD-10-CM | POA: Insufficient documentation

## 2015-02-14 LAB — URIC ACID: URIC ACID, SERUM: 5.9 mg/dL (ref 2.4–7.0)

## 2015-02-14 MED ORDER — FLUCONAZOLE 150 MG PO TABS: 150.0000 mg | ORAL_TABLET | ORAL | Status: DC

## 2015-02-14 MED ORDER — MELOXICAM 15 MG PO TABS: 15.0000 mg | ORAL_TABLET | Freq: Every day | ORAL | Status: DC

## 2015-02-14 NOTE — Assessment & Plan Note (Signed)
A: L trigger finger P: ice, NSAID, wrist splint

## 2015-02-14 NOTE — Assessment & Plan Note (Signed)
Great toe bunions: podiatry referral. Wear comfortable shoes with good support. Checking uric acid to rule out risk of gout.

## 2015-02-14 NOTE — Progress Notes (Signed)
F/U cough Stated not better since last visit Hx Tobacco- 6 cigarette per day, smoking less since last visit  Complaining of rash on chest, no strengh on rt hand, discoloration on feet

## 2015-02-14 NOTE — Assessment & Plan Note (Signed)
Screening HIV today  

## 2015-02-14 NOTE — Patient Instructions (Addendum)
Ms. Mariah Martinez,  1. Tinea skin rash: this is caused by a fungus. Take diflucan once weekly for 4 weeks. Keep skin cool and dry. If you walk and get sweaty. Please change your clothes after exercise.  2. Toe bunions: podiatry referral. Wear comfortable shoes with good support. Checking uric acid to rule out risk of gout.   3. Hand pain:  Right side is Carpal tunnel L side is trigger finger  mobic once daily for 2 weeks Ice  Wear wrist splints   F/u in 4 weeks for pap    Dr. Adrian Blackwater   Carpal Tunnel Syndrome The carpal tunnel is an area under the skin of the palm of your hand. Nerves, blood vessels, and strong tissues (tendons) pass through the tunnel. The tunnel can become puffy (swollen). If this happens, a nerve can be pinched in the wrist. This causes carpal tunnel syndrome.  HOME CARE  Take all medicine as told by your doctor.  If you were given a splint, wear it as told. Wear it at night or at times when your doctor told you to.  Rest your wrist from the activity that causes your pain.  Put ice on your wrist after long periods of wrist activity.  Put ice in a plastic bag.  Place a towel between your skin and the bag.  Leave the ice on for 15-20 minutes, 03-04 times a day.  Keep all doctor visits as told. GET HELP RIGHT AWAY IF:  You have new problems you cannot explain.  Your problems get worse and medicine does not help. MAKE SURE YOU:   Understand these instructions.  Will watch your condition.  Will get help right away if you are not doing well or get worse. Document Released: 10/17/2011 Document Revised: 01/20/2012 Document Reviewed: 10/17/2011 [facility] Patient Information 2015 [place], [place]. This information is not intended to replace advice given to you by your health care provider. Make sure you discuss any questions you have with your health care provider.

## 2015-02-14 NOTE — Assessment & Plan Note (Addendum)
A:  Right side is Carpal tunnel P: ice, NSAID, wrist splint

## 2015-02-14 NOTE — Progress Notes (Addendum)
   Subjective:    Patient ID: Shashana Fullington, female    DOB: 20-Jan-1956, 59 y.o.   MRN: [id number] CC; skin rash, toe pain, hand pain  HPI 59 yo M with HTN, obesity, smoking   1. Skin rash: x 1 month. Pruritic on chest and back. No contacts with rash.  2. Toe pain: R and L great toe with MTP swelling. Skin is shiny and slightly dark.   3. Hand pain: R hand thumb-3rd digit. L hand pain, middle finger. Swelling on R hand between 2nd and 3rd finger dorsally.   Soc Hx: current smoker  Review of Systems  Constitutional: Negative for fever, chills and unexpected weight change.  Musculoskeletal: Positive for joint swelling and arthralgias.  Skin: Positive for rash.   GAD-7: score of 11. 2:1-4. 1: 5-7    Objective:   Physical Exam BP 160/99 mmHg  Pulse 88  Temp(Src) 97.9 F (36.6 C) (Oral)  Resp 18  Ht 5\' 8"  (1.727 m)  Wt 264 lb (119.75 kg)  BMI 40.15 kg/m2  SpO2 98%  LMP 09/27/2013 General appearance: alert, cooperative, no distress and morbidly obese Extremities: 2 + radial pulses, + Phalen on R, L hand middle finger tender Feet with swelling at MTP laterally, no erythema or edema, mildly tender Skin: hypopigmented macular rash on chest and back        Assessment & Plan:

## 2015-02-14 NOTE — Assessment & Plan Note (Signed)
Tinea skin rash: this is caused by a fungus. Take diflucan once weekly for 4 weeks. Keep skin cool and dry. If you walk and get sweaty. Please change your clothes after exercise.

## 2015-02-15 LAB — HIV ANTIBODY (ROUTINE TESTING W REFLEX): HIV 1&2 Ab, 4th Generation: NONREACTIVE

## 2015-02-23 ENCOUNTER — Telehealth: Payer: Self-pay | Admitting: *Deleted

## 2015-02-23 NOTE — Telephone Encounter (Signed)
-----   Message from Boykin Nearing, MD sent at 02/15/2015  8:46 AM EDT ----- Uric acid normal, no gout Screening HIV negative

## 2015-02-23 NOTE — Telephone Encounter (Signed)
Pt aware of resutls

## 2015-03-03 ENCOUNTER — Ambulatory Visit: Payer: Self-pay | Admitting: Podiatrist

## 2015-03-10 ENCOUNTER — Ambulatory Visit: Payer: Self-pay | Admitting: Podiatrist

## 2015-03-17 ENCOUNTER — Ambulatory Visit: Payer: No Typology Code available for payment source | Admitting: Podiatry

## 2015-03-17 ENCOUNTER — Encounter: Payer: Self-pay | Admitting: Podiatry

## 2015-03-17 ENCOUNTER — Ambulatory Visit: Payer: No Typology Code available for payment source

## 2015-03-17 VITALS — BP 134/75 | HR 86 | Resp 12

## 2015-03-17 DIAGNOSIS — M2010 Hallux valgus (acquired), unspecified foot: Secondary | ICD-10-CM

## 2015-03-17 NOTE — Progress Notes (Signed)
   Subjective:    Patient ID: Mariah Martinez, female    DOB: 1956/03/29, 59 y.o.   MRN: [id number]  HPI  59 year old female percent the office with complaints of bilateral painful bunion to the left greater than the right. She said that she has had pain over the bunion particular left side for approximate 6 months. She said that she has pain overlying the prominence of the bump radicular shoe gear and pressure. She states of the area also written her shoes causing discomfort. She's had no prior treatment for this other than soaking her foot in [place] salt.. Denies any history of injury or trauma. No other complaints at this time.  Review of Systems  Neurological: Positive for weakness.       Objective:   Physical Exam AAO x3, NAD DP/PT pulses palpable bilaterally, CRT less than 3 seconds Protective sensation intact with Simms Weinstein monofilament, vibratory sensation intact, Achilles tendon reflex intact There is moderate structural HAV deformity present bilaterally. There is prominence of the first metatarsal head medially with evidence of irritation due to shoe gear. There is mild tenderness to palpation to the overlying this area with the left greater than right. There is mild discomfort with first MTPJ range of motion have there is no crepitation. There is no hypermobility.  No other areas of tenderness to bilateral lower extremities. MMT 5/5, ROM WNL.  No open lesions or pre-ulcerative lesions.  No overlying edema, erythema, increase in warmth to bilateral lower extremities.  No pain with calf compression, swelling, warmth, erythema bilaterally.       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 -Treatment options were discussed include alternatives, risks, competitions. Discussed both conservative and surgical treatment. -At this time a she's not had any treatment will proceed with conservative  treatment. I discussed with her shoe gear modifications and there is offloading and padding devices. I dispensed to her some tube foam pads. -In the future symptoms persist despite conservative treatment discussed with her surgical intervention. -Follow-up as needed. In the meantime call the office with any questions, concerns, changes symptoms.

## 2015-03-17 NOTE — Patient Instructions (Signed)

## 2015-04-03 HISTORY — PX: SHOULDER SURGERY: SHX246

## 2015-06-01 ENCOUNTER — Encounter: Payer: Self-pay | Admitting: Family Medicine

## 2015-06-01 ENCOUNTER — Ambulatory Visit: Payer: Self-pay | Attending: Family Medicine | Admitting: Family Medicine

## 2015-06-01 VITALS — BP 136/86 | HR 83 | Temp 99.1°F | Resp 18 | Ht 68.0 in | Wt 260.0 lb

## 2015-06-01 DIAGNOSIS — Z720 Tobacco use: Secondary | ICD-10-CM | POA: Insufficient documentation

## 2015-06-01 DIAGNOSIS — M67911 Unspecified disorder of synovium and tendon, right shoulder: Secondary | ICD-10-CM | POA: Insufficient documentation

## 2015-06-01 DIAGNOSIS — I10 Essential (primary) hypertension: Secondary | ICD-10-CM | POA: Insufficient documentation

## 2015-06-01 DIAGNOSIS — F17200 Nicotine dependence, unspecified, uncomplicated: Secondary | ICD-10-CM

## 2015-06-01 MED ORDER — NICOTINE 21 MG/24HR TD PT24: 21.0000 mg | MEDICATED_PATCH | Freq: Every day | TRANSDERMAL | Status: DC

## 2015-06-01 MED ORDER — NICOTINE 7 MG/24HR TD PT24: 7.0000 mg | MEDICATED_PATCH | Freq: Every day | TRANSDERMAL | Status: DC

## 2015-06-01 MED ORDER — MELOXICAM 15 MG PO TABS: 15.0000 mg | ORAL_TABLET | Freq: Every day | ORAL | Status: DC | PRN

## 2015-06-01 MED ORDER — NICOTINE 14 MG/24HR TD PT24: 14.0000 mg | MEDICATED_PATCH | Freq: Every day | TRANSDERMAL | Status: DC

## 2015-06-01 MED ORDER — HYDROCHLOROTHIAZIDE 12.5 MG PO CAPS: 12.5000 mg | ORAL_CAPSULE | Freq: Every day | ORAL | Status: DC

## 2015-06-01 NOTE — Patient Instructions (Addendum)
Mariah Martinez,  Thank you for coming in today  1. HTN: BP at goal continue current regimen  2. Smoking: Smoking cessation support: smoking cessation hotline: 1-800-QUIT-NOW.  Smoking cessation classes are available through [facility]. Call [phone] or visit our website at [email or web address] Nicotine patches ordered  21 mg for 6 weeks 14 mg for 2 weeks 7 mg for 2 weeks  3. Joint pains: mobic refilled, take daily as needed Form filled out for bathroom rail  F/u in 4-6 weeks for pap smear  Dr. Adrian Blackwater

## 2015-06-01 NOTE — Progress Notes (Signed)
F/U HTN  Need form to be sign

## 2015-06-01 NOTE — Progress Notes (Signed)
   Subjective:    Patient ID: Mariah Martinez, female    DOB: [birth year], 59 y.o.   MRN: [id number] CC: f/u HTN, joint pains, smoking  HPI  1. CHRONIC HYPERTENSION  Disease Monitoring  Blood pressure range: not checking   Chest pain: no   Dyspnea: no   Claudication: no   Medication compliance: yes  Medication Side Effects  Lightheadedness: no   Urinary frequency: no   Edema: no    Preventitive Healthcare:  Exercise: yes   Diet Pattern: regular meals  Salt Restriction: yes   2. Joint pains: R shoulder, hands. Had R shoulder surgery and has pain and weakness. Recently had trouble getting out of tub and it took 20 minutes to get out. Has carpal tunnel in R hand.    3. Smoking: desires to quit. Smoking 1/2 PPD. Has a new grandson and is motivated to quit. No CP or SOB.    Soc Hx: current smoker: 1/2 PPD  Review of Systems  Constitutional: Negative for fever and chills.  Respiratory: Negative for shortness of breath.   Cardiovascular: Negative for chest pain.  Gastrointestinal: Negative for abdominal pain and blood in stool.  Musculoskeletal: Positive for arthralgias.  Skin: Negative for rash.  Psychiatric/Behavioral: Negative for suicidal ideas and dysphoric mood.       Objective:   Physical Exam BP 136/86 mmHg  Pulse 83  Temp(Src) 99.1 F (37.3 C) (Oral)  Resp 18  Ht 5\' 8"  (1.727 m)  Wt 260 lb (117.935 kg)  BMI 39.54 kg/m2  SpO2 98%  LMP 09/27/2013  BP Readings from Last 3 Encounters:  06/01/15 136/86  03/17/15 134/75  02/14/15 160/99  General appearance: alert, cooperative and no distress Lungs: clear to auscultation bilaterally Heart: regular rate and rhythm, S1, S2 normal, no murmur, click, rub or gallop Extremities: extremities normal, atraumatic, no cyanosis or edema       Assessment & Plan:

## 2015-06-02 NOTE — Assessment & Plan Note (Signed)
Joint pains: mobic refilled, take daily as needed Form filled out for bathroom rail to prevent falls

## 2015-06-02 NOTE — Assessment & Plan Note (Signed)
Smoking: desires to quit  Smoking cessation support: smoking cessation hotline: 1-800-QUIT-NOW.  Smoking cessation classes are available through [facility] and [facility]. Call [phone] or visit our website at [email or web address] Nicotine patches ordered  21 mg for 6 weeks 14 mg for 2 weeks 7 mg for 2 weeks

## 2015-06-02 NOTE — Assessment & Plan Note (Signed)
A: Blood pressure at goal. Meds: compliant  P: I will continue the patient's current medication regimen since her blood pressure is at goal.   

## 2015-06-28 ENCOUNTER — Ambulatory Visit: Payer: Self-pay | Attending: Family Medicine

## 2015-07-18 ENCOUNTER — Other Ambulatory Visit: Payer: Self-pay | Admitting: Family Medicine

## 2015-08-16 ENCOUNTER — Telehealth: Payer: Self-pay | Admitting: Family Medicine

## 2015-08-16 NOTE — Telephone Encounter (Signed)
Leia Alf called from [employer], requesting a verbal verification for the patients reasonable accomodation. She would like to know if PCP signed the paper work. Please f/u.  Leia Alf- [phone]

## 2015-08-25 ENCOUNTER — Ambulatory Visit: Payer: Self-pay | Admitting: Family Medicine

## 2015-09-07 NOTE — Telephone Encounter (Signed)
Form was filled out and given to patient at last OV in July. Called # provided left VM requesting call back to determine what additional information is needed.

## 2015-09-15 ENCOUNTER — Ambulatory Visit: Payer: Self-pay | Attending: Family Medicine | Admitting: Family Medicine

## 2015-09-15 ENCOUNTER — Encounter: Payer: Self-pay | Admitting: Family Medicine

## 2015-09-15 VITALS — BP 136/83 | HR 85 | Temp 98.8°F | Resp 16 | Ht 60.0 in | Wt 262.0 lb

## 2015-09-15 DIAGNOSIS — R1010 Upper abdominal pain, unspecified: Secondary | ICD-10-CM | POA: Insufficient documentation

## 2015-09-15 DIAGNOSIS — Z124 Encounter for screening for malignant neoplasm of cervix: Secondary | ICD-10-CM | POA: Insufficient documentation

## 2015-09-15 DIAGNOSIS — N950 Postmenopausal bleeding: Secondary | ICD-10-CM | POA: Insufficient documentation

## 2015-09-15 DIAGNOSIS — R1011 Right upper quadrant pain: Secondary | ICD-10-CM

## 2015-09-15 MED ORDER — CYCLOBENZAPRINE HCL 10 MG PO TABS: 10.0000 mg | ORAL_TABLET | Freq: Three times a day (TID) | ORAL | Status: DC | PRN

## 2015-09-15 NOTE — Patient Instructions (Addendum)
Mariah Martinez was seen today for gynecologic exam and spasms.  Diagnoses and all orders for this visit:  Pap smear for cervical cancer screening -     Cytology - PAP [place]  Postmenopausal bleeding -     US Pelvis Complete; Future -     US Transvaginal Non-OB; Future  Abdominal wall pain in right upper quadrant -     cyclobenzaprine (FLEXERIL) 10 MG tablet; Take 1 tablet (10 mg total) by mouth 3 (three) times daily as needed for muscle spasms.   You will be called with pap results Ultrasound for post menopausal bleeding will be scheduled in early December  F/u in 3 months for HTN  Dr. Adrian Blackwater

## 2015-09-15 NOTE — Progress Notes (Signed)
Patient ID: Mariah Martinez, female   DOB: Sep 23, 1956, 59 y.o.   MRN: [id number]   Subjective:  Patient ID: Mariah Martinez, female    DOB: 1955-11-26  Age: 59 y.o. MRN: [id number]  CC: Gynecologic Exam and Spasms   HPI [name] presents for    1. Muscle spasm on abdominal area x 2-3 weeks. RUQ. S/p cholecystectomy   2. Pap: due. Sexually active at times. Using condoms. No discharge or irritation.  3. Post-menopausal bleeding: x 7 months. 2-3 days of bleeding monthly. No pelvic pain. Has hx of not having a period for one year or more.   Social History  Substance Use Topics  . Smoking status: Current Every Day Smoker -- 0.50 packs/day for 20 years    Types: Cigarettes  . Smokeless tobacco: Never Used  . Alcohol Use: 0.6 oz/week    1 Cans of beer per week     Comment: Monthly.    Outpatient Prescriptions Prior to Visit  Medication Sig Dispense Refill  . acetaminophen-codeine (TYLENOL #3) 300-30 MG per tablet Take 1 tablet by mouth every 4 (four) hours as needed. for pain  0  . albuterol (PROVENTIL HFA;VENTOLIN HFA) 108 (90 BASE) MCG/ACT inhaler Inhale 2 puffs into the lungs every 6 (six) hours as needed for shortness of breath. In the morning and at bedtime 1 Inhaler 0  . aspirin 81 MG tablet Take 81 mg by mouth daily.      Marland Kitchen atorvastatin (LIPITOR) 40 MG tablet Take 1 tablet (40 mg total) by mouth daily. 90 tablet 3  . CHONDROITIN SULFATE PO Take 1 tablet by mouth daily.    . hydrochlorothiazide (MICROZIDE) 12.5 MG capsule Take 1 capsule (12.5 mg total) by mouth daily. 60 capsule 2  . meloxicam (MOBIC) 15 MG tablet Take 1 tablet (15 mg total) by mouth daily as needed for pain. 30 tablet 2  . nicotine (NICODERM CQ - DOSED IN MG/24 HOURS) 14 mg/24hr patch Place 1 patch (14 mg total) onto the skin daily. 42 patch 0  . nicotine (NICODERM CQ - DOSED IN MG/24 HOURS) 21 mg/24hr patch Place 1 patch (21 mg total) onto the skin daily. 14 patch 0  . nicotine (NICODERM CQ -  DOSED IN MG/24 HR) 7 mg/24hr patch Place 1 patch (7 mg total) onto the skin daily. 14 patch 0  . Multiple Vitamins-Minerals (MULTIVITAMIN WITH MINERALS) tablet Take 1 tablet by mouth daily.     No facility-administered medications prior to visit.    ROS Review of Systems  Constitutional: Negative for fever and chills.  Eyes: Negative for visual disturbance.  Respiratory: Negative for shortness of breath.   Cardiovascular: Negative for chest pain.  Gastrointestinal: Positive for abdominal pain. Negative for blood in stool.  Genitourinary: Positive for vaginal bleeding and menstrual problem.  Musculoskeletal: Negative for back pain and arthralgias.  Skin: Negative for rash.  Allergic/Immunologic: Negative for immunocompromised state.  Hematological: Negative for adenopathy. Does not bruise/bleed easily.  Psychiatric/Behavioral: Negative for suicidal ideas and dysphoric mood.    Objective:  BP 136/83 mmHg  Pulse 85  Temp(Src) 98.8 F (37.1 C) (Oral)  Resp 16  Ht 5' (1.524 m)  Wt 262 lb (118.842 kg)  BMI 51.17 kg/m2  SpO2 99%  LMP 09/27/2013  BP/Weight 09/15/2015 3/53/2992 02/10/6833  Systolic BP 196 222 979  Diastolic BP 83 86 75  Wt. (Lbs) 262 260 -  BMI 51.17 39.54 -    Physical Exam  Constitutional: She is oriented to person, place, and  time. She appears well-developed and well-nourished. No distress.  HENT:  Head: Normocephalic and atraumatic.  Cardiovascular: Normal rate, regular rhythm, normal heart sounds and intact distal pulses.   Pulmonary/Chest: Effort normal and breath sounds normal.  Abdominal: Soft. Bowel sounds are normal. She exhibits no distension and no mass. There is tenderness. There is no rebound and no guarding.  Obese abdomen RUQ tenderness, slightly Pain worse with abdominal flexion   Genitourinary: Vagina normal and uterus normal. Pelvic exam was performed with patient prone. There is no rash, tenderness or lesion on the right labia. There is no  rash, tenderness or lesion on the left labia. Cervix exhibits no motion tenderness, no discharge and no friability.  Musculoskeletal: She exhibits no edema.  Lymphadenopathy:       Right: No inguinal adenopathy present.       Left: No inguinal adenopathy present.  Neurological: She is alert and oriented to person, place, and time.  Skin: Skin is warm and dry. No rash noted.  Psychiatric: She has a normal mood and affect.     Assessment & Plan:   Problem List Items Addressed This Visit    None    Visit Diagnoses    Pap smear for cervical cancer screening    -  Primary    Relevant Orders    Cytology - PAP [place]    Postmenopausal bleeding        Relevant Orders    US Pelvis Complete    US Transvaginal Non-OB    Abdominal wall pain in right upper quadrant        Relevant Medications    cyclobenzaprine (FLEXERIL) 10 MG tablet       No orders of the defined types were placed in this encounter.    Follow-up: No Follow-up on file.   Boykin Nearing MD

## 2015-09-15 NOTE — Progress Notes (Signed)
Pap smear Complaining of muscle spasm on abdominal area  Pain scale #6

## 2015-09-19 ENCOUNTER — Telehealth: Payer: Self-pay | Admitting: *Deleted

## 2015-09-19 LAB — CERVICOVAGINAL ANCILLARY ONLY
Chlamydia: NEGATIVE
NEISSERIA GONORRHEA: NEGATIVE
WET PREP (BD AFFIRM): NEGATIVE

## 2015-09-19 NOTE — Telephone Encounter (Signed)
LVM to return call   us appointment on Dec 8,2016 at 8:00 arriving 15 min early  Drink 32oz of water do not void appointment at [facility] radiology

## 2015-09-20 LAB — CYTOLOGY - PAP

## 2015-09-21 ENCOUNTER — Ambulatory Visit (INDEPENDENT_AMBULATORY_CARE_PROVIDER_SITE_OTHER): Payer: Self-pay | Admitting: Physician Assistant

## 2015-09-21 VITALS — BP 128/80 | HR 92 | Temp 98.0°F | Resp 17 | Ht 67.0 in | Wt 262.0 lb

## 2015-09-21 DIAGNOSIS — Z0289 Encounter for other administrative examinations: Secondary | ICD-10-CM

## 2015-09-21 NOTE — Progress Notes (Signed)
Subjective:    Patient ID: Mariah Martinez, female    DOB: 09-03-1956, 59 y.o.   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 PMH for HTN and dyslipidemia that is controlled and has annual f/u. Had rotator cuff repair 03/2015 and still seeing ortho and going to PT. Has taken cyclobenzaprine once this week for stomach spasms. Denies insulin use/DM, OSA or CPAP use, daytime sleepiness, mobility issues, or depression. Has never gotten less than a 1 year card. Has eye appt this year.  Has f/u with GYN for transvaginal US for spotting.  Review of Systems  Constitutional: Negative for fever, chills, activity change, appetite change, fatigue and unexpected weight change.  HENT: Negative.  Negative for tinnitus.   Eyes: Negative.  Negative for photophobia and visual disturbance.  Respiratory: Negative.  Negative for cough, shortness of breath and wheezing.   Cardiovascular: Negative.  Negative for chest pain, palpitations and leg swelling.  Gastrointestinal: Negative.  Negative for nausea, vomiting and blood in stool.  Genitourinary: Negative.  Negative for dysuria, hematuria and decreased urine volume.  Musculoskeletal: Positive for arthralgias. Negative for myalgias, back pain, joint swelling and gait problem.  Neurological: Positive for weakness. Negative for dizziness, seizures, light-headedness and headaches.  Psychiatric/Behavioral: Negative.        Objective:   Physical Exam  Constitutional: She is oriented to person, place, and time. She appears well-developed and well-nourished. No distress.  Blood pressure 128/80, pulse 92, temperature 98 F (36.7 C), temperature source Oral, resp. rate 17, height 5\' 7"  (1.702 m), weight 262 lb (118.842 kg), last menstrual period 09/27/2013, SpO2 97 %.  HENT:  Head: Normocephalic and atraumatic.  Right Ear: External ear normal.  Left Ear: External ear normal.  Mouth/Throat: Oropharynx is clear and moist.  Eyes:  Conjunctivae and EOM are normal. Pupils are equal, round, and reactive to light. Right eye exhibits no discharge. Left eye exhibits no discharge. No scleral icterus.  Neck: Normal range of motion. Neck supple. No JVD present. No thyromegaly present.  Cardiovascular: Normal rate, regular rhythm, normal heart sounds and intact distal pulses.  Exam reveals no gallop and no friction rub.   No murmur heard. Pulmonary/Chest: Effort normal and breath sounds normal. No respiratory distress. She has no wheezes. She has no rales. She exhibits no tenderness.  Abdominal: Soft. Bowel sounds are normal. She exhibits no distension and no mass. There is no tenderness. There is no rebound and no guarding.  Musculoskeletal: Normal range of motion. She exhibits no edema or tenderness.       Right shoulder: She exhibits decreased strength. She exhibits normal range of motion, no tenderness, no bony tenderness, no swelling, no effusion, no crepitus, no deformity, no laceration, no pain and no spasm.       Left shoulder: Normal.       Cervical back: Normal.  Lymphadenopathy:    She has no cervical adenopathy.  Neurological: She is alert and oriented to person, place, and time. She has normal reflexes. No cranial nerve deficit. She exhibits normal muscle tone. Coordination normal.  Skin: Skin is warm and dry. No rash noted. She is not diaphoretic. No erythema. No pallor.  Psychiatric: She has a normal mood and affect. Her behavior is normal. Judgment and thought content normal.       Assessment & Plan:  1. 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 Continued f/u with ortho and PT for shoulder. Continued f/u for  BP with PCP.   Alveta Heimlich PA-C  [facility] 09/21/2015 11:33 AM

## 2015-09-29 NOTE — Progress Notes (Signed)
  Medical screening examination/treatment/procedure(s) were performed by non-physician practitioner and as supervising physician I was immediately available for consultation/collaboration.     

## 2015-10-19 ENCOUNTER — Ambulatory Visit (HOSPITAL_COMMUNITY): Payer: Self-pay

## 2015-11-24 MED FILL — ?ATORVASTATIN 40MG TABLET: 40 | 30 days supply | Qty: 30 | Fill #9

## 2015-11-24 MED FILL — HYDROCHLOROTHIAZIDE 12.5 MG: 12.5 | 30 days supply | Qty: 30 | Fill #4

## 2015-12-06 ENCOUNTER — Encounter (HOSPITAL_BASED_OUTPATIENT_CLINIC_OR_DEPARTMENT_OTHER): Payer: Self-pay | Admitting: Clinical

## 2015-12-06 ENCOUNTER — Ambulatory Visit: Payer: Self-pay | Attending: Family Medicine

## 2015-12-06 DIAGNOSIS — Z658 Other specified problems related to psychosocial circumstances: Secondary | ICD-10-CM

## 2015-12-06 NOTE — Progress Notes (Signed)
ASSESSMENT: Pt currently experiencing psychosocial stressors, needs to f/u with PCP and [facility]; would benefit from psychoeducation and solution-focused therapy to address psychosocial stressors.  Stage of Change: contemplative  PLAN: 1. F/U with behavioral health consultant in two weeks 2. Psychiatric Medications: none. 3. Behavioral recommendation(s):   -Make appointment to see PCP -Bring back to [facility] any medical bills to financial counselors to advise -Consider [employer] for social support group(s) -Consider reading educational material regarding coping with symptoms of anxiety and depression SUBJECTIVE: Pt. referred by financial counseling for psychosocial Pt. reports the following symptoms/concerns: Pt states that she just feels overwhelmed right now with her financial situation and hospital bills, she needs to see her physician at [facility], has been out of work and unable to apply for unemployment, has used [employer] previously and found it helpful, and is feeling "some depression" and anxiety.  Duration of problem: More than one month Severity: moderate  OBJECTIVE: Orientation & Cognition: Oriented x3. Thought processes normal and appropriate to situation. Mood: teary. Affect: appropriate Appearance: appropriate Risk of harm to self or others: no known risk of harm to self or others Substance use: alcohol, tobacco Assessments administered: none  Diagnosis: Psychosocial stressors CPT Code: Z65.8 -------------------------------------------- Other(s) present in the room: none  Time spent with patient in exam room: 16 minutes

## 2015-12-06 NOTE — Patient Instructions (Signed)
PLAN:  1. F/U with behavioral health consultant in two weeks 2. Psychiatric medications: none 3. Behavioral recommendation(s):  -Make appointment to see PCP -Bring back to [facility] any medical bills to financial counselors to advise -Consider [employer] for social support group(s) -Consider reading educational material regarding coping with symptoms of anxiety and depression

## 2015-12-15 ENCOUNTER — Encounter: Payer: Self-pay | Admitting: Family Medicine

## 2015-12-15 ENCOUNTER — Ambulatory Visit (HOSPITAL_BASED_OUTPATIENT_CLINIC_OR_DEPARTMENT_OTHER): Payer: Self-pay | Admitting: Clinical

## 2015-12-15 ENCOUNTER — Ambulatory Visit: Payer: Self-pay | Attending: Family Medicine | Admitting: Family Medicine

## 2015-12-15 VITALS — BP 128/83 | HR 85 | Temp 98.2°F | Resp 16 | Ht 68.0 in | Wt 269.0 lb

## 2015-12-15 DIAGNOSIS — R0602 Shortness of breath: Secondary | ICD-10-CM | POA: Insufficient documentation

## 2015-12-15 DIAGNOSIS — Z7982 Long term (current) use of aspirin: Secondary | ICD-10-CM | POA: Insufficient documentation

## 2015-12-15 DIAGNOSIS — Z760 Encounter for issue of repeat prescription: Secondary | ICD-10-CM | POA: Insufficient documentation

## 2015-12-15 DIAGNOSIS — Z1159 Encounter for screening for other viral diseases: Secondary | ICD-10-CM

## 2015-12-15 DIAGNOSIS — F4321 Adjustment disorder with depressed mood: Secondary | ICD-10-CM

## 2015-12-15 DIAGNOSIS — M25562 Pain in left knee: Secondary | ICD-10-CM | POA: Insufficient documentation

## 2015-12-15 DIAGNOSIS — F17210 Nicotine dependence, cigarettes, uncomplicated: Secondary | ICD-10-CM | POA: Insufficient documentation

## 2015-12-15 DIAGNOSIS — I10 Essential (primary) hypertension: Secondary | ICD-10-CM | POA: Insufficient documentation

## 2015-12-15 DIAGNOSIS — G8929 Other chronic pain: Secondary | ICD-10-CM | POA: Insufficient documentation

## 2015-12-15 DIAGNOSIS — Z0000 Encounter for general adult medical examination without abnormal findings: Secondary | ICD-10-CM

## 2015-12-15 DIAGNOSIS — R1011 Right upper quadrant pain: Secondary | ICD-10-CM

## 2015-12-15 DIAGNOSIS — R1010 Upper abdominal pain, unspecified: Secondary | ICD-10-CM

## 2015-12-15 DIAGNOSIS — Z79899 Other long term (current) drug therapy: Secondary | ICD-10-CM | POA: Insufficient documentation

## 2015-12-15 DIAGNOSIS — M25561 Pain in right knee: Secondary | ICD-10-CM | POA: Insufficient documentation

## 2015-12-15 DIAGNOSIS — E785 Hyperlipidemia, unspecified: Secondary | ICD-10-CM | POA: Insufficient documentation

## 2015-12-15 LAB — COMPLETE METABOLIC PANEL WITH GFR
ALBUMIN: 3.6 g/dL (ref 3.6–5.1)
ALK PHOS: 132 U/L — AB (ref 33–130)
ALT: 13 U/L (ref 6–29)
AST: 14 U/L (ref 10–35)
BILIRUBIN TOTAL: 0.6 mg/dL (ref 0.2–1.2)
BUN: 14 mg/dL (ref 7–25)
CALCIUM: 9.5 mg/dL (ref 8.6–10.4)
CO2: 23 mmol/L (ref 20–31)
Chloride: 105 mmol/L (ref 98–110)
Creat: 0.7 mg/dL (ref 0.50–0.99)
GFR, Est African American: >89 mL/min (ref >=60)
GLUCOSE: 129 mg/dL — AB (ref 65–99)
Potassium: 4.8 mmol/L (ref 3.5–5.3)
SODIUM: 140 mmol/L (ref 135–146)
TOTAL PROTEIN: 7.4 g/dL (ref 6.1–8.1)

## 2015-12-15 LAB — LIPID PANEL
CHOLESTEROL: 164 mg/dL (ref 125–200)
HDL: 43 mg/dL — AB (ref >=46)
LDL Cholesterol: 95 mg/dL (ref <130)
TRIGLYCERIDES: 129 mg/dL (ref <150)
Total CHOL/HDL Ratio: 3.8 Ratio (ref <=5.0)
VLDL: 26 mg/dL (ref <30)

## 2015-12-15 LAB — POCT GLYCOSYLATED HEMOGLOBIN (HGB A1C): Hemoglobin A1C: 5.9

## 2015-12-15 MED ORDER — HYDROCHLOROTHIAZIDE 12.5 MG PO CAPS: 12.5000 mg | ORAL_CAPSULE | Freq: Every day | ORAL | Status: DC

## 2015-12-15 MED ORDER — ACETAMINOPHEN-CODEINE #3 300-30 MG PO TABS: 1.0000 | ORAL_TABLET | Freq: Three times a day (TID) | ORAL | Status: DC | PRN

## 2015-12-15 MED ORDER — ATORVASTATIN CALCIUM 40 MG PO TABS: 40.0000 mg | ORAL_TABLET | Freq: Every day | ORAL | Status: AC

## 2015-12-15 MED ORDER — ALBUTEROL SULFATE HFA 108 (90 BASE) MCG/ACT IN AERS: 2.0000 | INHALATION_SPRAY | Freq: Four times a day (QID) | RESPIRATORY_TRACT | Status: DC | PRN

## 2015-12-15 MED ORDER — CYCLOBENZAPRINE HCL 10 MG PO TABS: 10.0000 mg | ORAL_TABLET | Freq: Three times a day (TID) | ORAL | Status: DC | PRN

## 2015-12-15 MED FILL — ?CYCLOBENZAPRINE 10 MG TAB: 10 MG | 10 days supply | Qty: 30 | Fill #0

## 2015-12-15 MED FILL — ACETAMINOPHEN/COD #3 TABLET: 300-30 | 20 days supply | Qty: 60 | Fill #0

## 2015-12-15 MED FILL — VENTOLIN HFA 90 MCG INHALER: 108 (90 BAS | 18 days supply | Qty: 18 | Fill #0

## 2015-12-15 NOTE — Patient Instructions (Addendum)
Mariah Martinez was seen today for medication refill and knee pain.  Diagnoses and all orders for this visit:  Healthcare maintenance -     HgB A1c  Need for hepatitis C screening test -     Hepatitis C antibody, reflex  Essential hypertension -     hydrochlorothiazide (MICROZIDE) 12.5 MG capsule; Take 1 capsule (12.5 mg total) by mouth daily. -     COMPLETE METABOLIC PANEL WITH GFR  HLD (hyperlipidemia) -     atorvastatin (LIPITOR) 40 MG tablet; Take 1 tablet (40 mg total) by mouth daily. -     Lipid Panel  SOB (shortness of breath) on exertion -     albuterol (PROVENTIL HFA;VENTOLIN HFA) 108 (90 Base) MCG/ACT inhaler; Inhale 2 puffs into the lungs every 6 (six) hours as needed for shortness of breath. In the morning and at bedtime  Abdominal wall pain in right upper quadrant -     cyclobenzaprine (FLEXERIL) 10 MG tablet; Take 1 tablet (10 mg total) by mouth 3 (three) times daily as needed for muscle spasms.  Bilateral chronic knee pain -     acetaminophen-codeine (TYLENOL #3) 300-30 MG tablet; Take 1 tablet by mouth every 8 (eight) hours as needed. Reported on 12/15/2015    Smoking cessation support: smoking cessation hotline: 1-800-QUIT-NOW.  Smoking cessation classes are available through [facility] and [facility]. Call [phone] or visit our website at [email or web address]   F/u in 8 weeks for knee pain   Dr. Adrian Blackwater

## 2015-12-15 NOTE — Assessment & Plan Note (Signed)
A: persistent knee pain, chronic P: Weight loss Tylenol #3

## 2015-12-15 NOTE — Progress Notes (Signed)
ASSESSMENT: Pt currently experiencing Adjustment disorder with depressed mood, needs to f/u with PCP; would benefit with f/u with [facility] with supportive counseling regarding coping with adjustment and depressed mood. Stage of Change: contemplative  PLAN: 1. F/U with behavioral health consultant in as needed 2. Psychiatric Medications: none. 3. Behavioral recommendation(s):   -Continue to keep positive outlook -Consider (free) outing with grandchildren in next week -Consider lemon water in mornings  SUBJECTIVE: Pt. referred by Dr Adrian Blackwater for emotional support:  Pt. reports the following symptoms/concerns: Pt states that she is feeling down at times, attempting to get used to changing health and finances; she feels bad that she is unable to do for her grandchildren financially; copes by trying to keep a positive mindset, and is thinking about finding free outings to take grandchildren Duration of problem: about 2 months Severity: mild  OBJECTIVE: Orientation & Cognition: Oriented x3. Thought processes normal and appropriate to situation. Mood: teary. Affect: appropriate Appearance: appropriate Risk of harm to self or others: no known risk of harm to self or others Substance use: none Assessments administered: PHQ2; 0  Diagnosis: Adjustment disorder with depressed mood CPT Code: F43.21 -------------------------------------------- Other(s) present in the room: none  Time spent with patient in exam room: 30 minutes, 10:00-10:30am    Depression screen [facility] 2/9 09/21/2015 09/15/2015 06/01/2015  Decreased Interest 0 0 0  Down, Depressed, Hopeless 0 0 0  PHQ - 2 Score 0 0 0  Altered sleeping - - -  Tired, decreased energy - - -  Change in appetite - - -  Feeling bad or failure about yourself  - - -  Trouble concentrating - - -  Moving slowly or fidgety/restless - - -  Suicidal thoughts - - -  PHQ-9 Score - - -

## 2015-12-15 NOTE — Progress Notes (Signed)
Subjective:  Patient ID: Mariah Martinez, female    DOB: [birth year]  Age: 60 y.o. MRN: [id number]  CC: Medication Refill and Knee Pain   HPI Mariah Martinez present   1. Knee pain: R>L, medial knee. Worse with cold weather and walking. She has gained weight. There is slight swelling. No redness.   2. CHRONIC HYPERTENSION  Disease Monitoring  Blood pressure range: not checking   Chest pain: no   Dyspnea: no   Claudication: no   Medication compliance: yes  Medication Side Effects  Lightheadedness: no   Urinary frequency: no   Edema: no   3. HLD: taking lipitor. No significant myalgias. Has fatty liver w/o hepatitis. Has cramps in abdomen that come and go. Flexeril has helped in the past. She is out of flexeril now.     Requesting med refills    Social History  Substance Use Topics  . Smoking status: Current Every Day Smoker -- 0.75 packs/day for 20 years    Types: Cigarettes  . Smokeless tobacco: Never Used  . Alcohol Use: 0.6 oz/week    1 Cans of beer per week     Comment: Monthly.   Outpatient Prescriptions Prior to Visit  Medication Sig Dispense Refill  . acetaminophen-codeine (TYLENOL #3) 300-30 MG per tablet Take 1 tablet by mouth every 4 (four) hours as needed. for pain  0  . albuterol (PROVENTIL HFA;VENTOLIN HFA) 108 (90 BASE) MCG/ACT inhaler Inhale 2 puffs into the lungs every 6 (six) hours as needed for shortness of breath. In the morning and at bedtime (Patient not taking: Reported on 09/21/2015) 1 Inhaler 0  . aspirin 81 MG tablet Take 81 mg by mouth daily.      Marland Kitchen atorvastatin (LIPITOR) 40 MG tablet Take 1 tablet (40 mg total) by mouth daily. 90 tablet 3  . CHONDROITIN SULFATE PO Take 1 tablet by mouth daily.    . cyclobenzaprine (FLEXERIL) 10 MG tablet Take 1 tablet (10 mg total) by mouth 3 (three) times daily as needed for muscle spasms. 30 tablet 0  . hydrochlorothiazide (MICROZIDE) 12.5 MG capsule Take 1 capsule (12.5 mg total) by mouth daily. 60  capsule 2  . hydrochlorothiazide (MICROZIDE) 12.5 MG capsule TAKE ONE CAPSULE BY MOUTH DAILY 60 capsule 2  . meloxicam (MOBIC) 15 MG tablet Take 1 tablet (15 mg total) by mouth daily as needed for pain. (Patient not taking: Reported on 09/21/2015) 30 tablet 2  . Multiple Vitamins-Minerals (MULTIVITAMIN WITH MINERALS) tablet Take 1 tablet by mouth daily.     No facility-administered medications prior to visit.    ROS Review of Systems  Constitutional: Negative for fever and chills.  Eyes: Negative for visual disturbance.  Respiratory: Negative for shortness of breath.   Cardiovascular: Negative for chest pain.  Gastrointestinal: Positive for abdominal pain. Negative for blood in stool.  Musculoskeletal: Positive for arthralgias. Negative for back pain.  Skin: Negative for rash.  Allergic/Immunologic: Negative for immunocompromised state.  Hematological: Negative for adenopathy. Does not bruise/bleed easily.  Psychiatric/Behavioral: Negative for suicidal ideas and dysphoric mood.    Objective:  BP 128/83 mmHg  Pulse 85  Temp(Src) 98.2 F (36.8 C) (Oral)  Resp 16  Ht 5\' 8"  (1.727 m)  Wt 269 lb (122.018 kg)  BMI 40.91 kg/m2  SpO2 99%  LMP 09/27/2013  BP/Weight 12/15/2015 09/21/2015 0000000  Systolic BP 0000000 0000000 [id number]  Diastolic BP 83 80 83  Wt. (Lbs) 269 262 262  BMI 40.91 41.03 51.17   Physical Exam  Constitutional: She is oriented to person, place, and time. She appears well-developed and well-nourished. No distress.  HENT:  Head: Normocephalic and atraumatic.  Cardiovascular: Normal rate, regular rhythm, normal heart sounds and intact distal pulses.   Pulmonary/Chest: Effort normal and breath sounds normal.  Abdominal:  Obese abdomen   Musculoskeletal: She exhibits no edema.       Right knee: She exhibits decreased range of motion. Tenderness found. Medial joint line tenderness noted.       Left knee: She exhibits decreased range of motion. Tenderness found. Medial  joint line tenderness noted.  Neurological: She is alert and oriented to person, place, and time.  Skin: Skin is warm and dry. No rash noted.  Psychiatric: She has a normal mood and affect.    Lab Results  Component Value Date   HGBA1C 5.90 12/15/2015    Assessment & Plan:  Mariah Martinez was seen today for medication refill and knee pain.  Diagnoses and all orders for this visit:  Healthcare maintenance -     HgB A1c  Need for hepatitis C screening test -     Hepatitis C antibody, reflex  Essential hypertension -     hydrochlorothiazide (MICROZIDE) 12.5 MG capsule; Take 1 capsule (12.5 mg total) by mouth daily. -     COMPLETE METABOLIC PANEL WITH GFR  HLD (hyperlipidemia) -     atorvastatin (LIPITOR) 40 MG tablet; Take 1 tablet (40 mg total) by mouth daily. -     Lipid Panel  SOB (shortness of breath) on exertion -     albuterol (PROVENTIL HFA;VENTOLIN HFA) 108 (90 Base) MCG/ACT inhaler; Inhale 2 puffs into the lungs every 6 (six) hours as needed for shortness of breath. In the morning and at bedtime  Abdominal wall pain in right upper quadrant -     cyclobenzaprine (FLEXERIL) 10 MG tablet; Take 1 tablet (10 mg total) by mouth 3 (three) times daily as needed for muscle spasms.  Bilateral chronic knee pain -     acetaminophen-codeine (TYLENOL #3) 300-30 MG tablet; Take 1 tablet by mouth every 8 (eight) hours as needed. Reported on 12/15/2015   Follow-up: No Follow-up on file.   Boykin Nearing MD

## 2015-12-15 NOTE — Assessment & Plan Note (Signed)
A: Blood pressure at goal. Meds: compliant  P: I will continue the patient's current medication regimen since her blood pressure is at goal.   

## 2015-12-15 NOTE — Assessment & Plan Note (Signed)
A; HLD and fatty liver P: Weight loss Lipid check

## 2015-12-15 NOTE — Progress Notes (Signed)
F/U medication refills  Pain scale #4 knee and joint pain  Tobacco user 1/2 ppday No suicidal thought in the past two weeks

## 2015-12-16 LAB — HEPATITIS C ANTIBODY: HCV Ab: NEGATIVE

## 2015-12-19 ENCOUNTER — Telehealth: Payer: Self-pay

## 2015-12-19 NOTE — Telephone Encounter (Signed)
Pt would like to come by and pick up a copy of the xray on her shoulder. Please call [phone]

## 2015-12-19 NOTE — Telephone Encounter (Signed)
At the patient's request, I made a copy of her x ray on CD and placed in the pick up drawer. 

## 2015-12-21 ENCOUNTER — Other Ambulatory Visit: Payer: Self-pay | Admitting: *Deleted

## 2015-12-21 DIAGNOSIS — R0602 Shortness of breath: Secondary | ICD-10-CM

## 2015-12-21 MED ORDER — ALBUTEROL SULFATE HFA 108 (90 BASE) MCG/ACT IN AERS: 2.0000 | INHALATION_SPRAY | Freq: Four times a day (QID) | RESPIRATORY_TRACT | Status: AC | PRN

## 2015-12-26 MED FILL — ATORVASTATIN 40 MG TABLET: 40 | 30 days supply | Qty: 30 | Fill #10

## 2015-12-26 MED FILL — ?HYDROCHLOROTHIAZIDE 12.5MG: 12.5 | 30 days supply | Qty: 30 | Fill #5

## 2015-12-28 ENCOUNTER — Telehealth: Payer: Self-pay | Admitting: Family Medicine

## 2015-12-28 DIAGNOSIS — K089 Disorder of teeth and supporting structures, unspecified: Secondary | ICD-10-CM

## 2015-12-28 NOTE — Telephone Encounter (Signed)
Patient came in requesting a referral for a dentist. Please follow up

## 2015-12-28 NOTE — Telephone Encounter (Signed)
I will need a referral from her pcp thanks

## 2016-01-23 MED FILL — ?ATORVASTATIN 40MG TABLET: 40 | 30 days supply | Qty: 30 | Fill #0

## 2016-01-23 MED FILL — ?HYDROCHLOROTHIAZIDE 12.5MG: 12.5 | 30 days supply | Qty: 30 | Fill #0

## 2016-01-23 MED FILL — CYCLOBENZAPRINE 10 MG TAB: 10 | 10 days supply | Qty: 30 | Fill #1

## 2016-02-20 ENCOUNTER — Telehealth: Payer: Self-pay | Admitting: Clinical

## 2016-02-20 NOTE — Telephone Encounter (Signed)
Termination of therapeutic relationship; pt aware she may make appointment with [facility], as needed, until the end of April.

## 2016-02-27 MED FILL — ?HYDROCHLOROTHIAZIDE 12.5MG: 12.5 | 30 days supply | Qty: 30 | Fill #1

## 2016-03-26 MED FILL — HYDROCHLOROTHIAZIDE 12.5 MG: 12.5 | 30 days supply | Qty: 30 | Fill #2

## 2016-03-26 MED FILL — ?CYCLOBENZAPRINE 10 MG TABL: 10 | 10 days supply | Qty: 30 | Fill #2

## 2016-04-25 MED FILL — ?HYDROCHLOROTHIAZIDE 12.5MG: 12.5 | 30 days supply | Qty: 30 | Fill #3

## 2016-05-20 ENCOUNTER — Ambulatory Visit: Payer: Self-pay | Attending: Family Medicine

## 2016-05-27 MED FILL — ?HYDROCHLOROTHIAZIDE 12.5MG: 12.5 | 30 days supply | Qty: 30 | Fill #4

## 2016-05-30 ENCOUNTER — Encounter: Payer: Self-pay | Admitting: Family Medicine

## 2016-05-30 ENCOUNTER — Ambulatory Visit: Payer: Self-pay | Attending: Family Medicine | Admitting: Family Medicine

## 2016-05-30 VITALS — BP 147/98 | HR 84 | Temp 98.6°F | Ht 68.0 in | Wt 271.2 lb

## 2016-05-30 DIAGNOSIS — Z6841 Body Mass Index (BMI) 40.0 and over, adult: Secondary | ICD-10-CM

## 2016-05-30 DIAGNOSIS — M25561 Pain in right knee: Secondary | ICD-10-CM

## 2016-05-30 DIAGNOSIS — G8929 Other chronic pain: Secondary | ICD-10-CM

## 2016-05-30 DIAGNOSIS — M25531 Pain in right wrist: Secondary | ICD-10-CM

## 2016-05-30 DIAGNOSIS — R1010 Upper abdominal pain, unspecified: Secondary | ICD-10-CM

## 2016-05-30 DIAGNOSIS — H6121 Impacted cerumen, right ear: Secondary | ICD-10-CM

## 2016-05-30 DIAGNOSIS — M25562 Pain in left knee: Secondary | ICD-10-CM

## 2016-05-30 DIAGNOSIS — Z1231 Encounter for screening mammogram for malignant neoplasm of breast: Secondary | ICD-10-CM

## 2016-05-30 DIAGNOSIS — R1011 Right upper quadrant pain: Secondary | ICD-10-CM | POA: Insufficient documentation

## 2016-05-30 DIAGNOSIS — M25539 Pain in unspecified wrist: Secondary | ICD-10-CM

## 2016-05-30 DIAGNOSIS — R252 Cramp and spasm: Secondary | ICD-10-CM | POA: Insufficient documentation

## 2016-05-30 DIAGNOSIS — M25532 Pain in left wrist: Secondary | ICD-10-CM

## 2016-05-30 LAB — POCT GLYCOSYLATED HEMOGLOBIN (HGB A1C): HEMOGLOBIN A1C: 5.6

## 2016-05-30 MED ORDER — PREDNISONE 20 MG PO TABS: ORAL_TABLET | ORAL | Status: DC

## 2016-05-30 MED ORDER — ASPIRIN 81 MG PO TABS: 81.0000 mg | ORAL_TABLET | ORAL | Status: AC

## 2016-05-30 MED ORDER — ACETAMINOPHEN-CODEINE #3 300-30 MG PO TABS: 2.0000 | ORAL_TABLET | Freq: Three times a day (TID) | ORAL | Status: AC | PRN

## 2016-05-30 MED ORDER — CARBAMIDE PEROXIDE 6.5 % OT SOLN: 5.0000 [drp] | Freq: Two times a day (BID) | OTIC | Status: DC

## 2016-05-30 MED ORDER — CYCLOBENZAPRINE HCL 10 MG PO TABS: 10.0000 mg | ORAL_TABLET | Freq: Three times a day (TID) | ORAL | Status: DC | PRN

## 2016-05-30 MED FILL — ACETAMINOPHEN/COD #3 TABLET: 300-30 | 10 days supply | Qty: 60 | Fill #0

## 2016-05-30 MED FILL — ?CYCLOBENZAPRINE 10 MG TABL: 10 | 10 days supply | Qty: 30 | Fill #0

## 2016-05-30 MED FILL — predniSONE 20 MG TABS: 20 | 12 days supply | Qty: 24 | Fill #0

## 2016-05-30 MED FILL — EAR DROPS 6.5%: 6.5 | 20 days supply | Qty: 15 | Fill #0

## 2016-05-30 NOTE — Patient Instructions (Addendum)
Arkeisha was seen today for hand pain and wrist pain.  Diagnoses and all orders for this visit:  Bilateral chronic knee pain -     acetaminophen-codeine (TYLENOL #3) 300-30 MG tablet; Take 2 tablets by mouth every 8 (eight) hours as needed. Reported on 12/15/2015  Abdominal wall pain in right upper quadrant -     cyclobenzaprine (FLEXERIL) 10 MG tablet; Take 1 tablet (10 mg total) by mouth 3 (three) times daily as needed for muscle spasms.  Morbid obesity with BMI of 40.0-44.9, adult (HCC)  Muscle cramping -     cyclobenzaprine (FLEXERIL) 10 MG tablet; Take 1 tablet (10 mg total) by mouth 3 (three) times daily as needed for muscle spasms. -     Cancel: BASIC METABOLIC PANEL WITH GFR -     HgB A1c -     Vitamin D, 25-hydroxy -     Vitamin B12 -     BASIC METABOLIC PANEL WITH GFR  Bilateral wrist pain -     DG Wrist Complete Left; Future -     DG Wrist Complete Right; Future -     acetaminophen-codeine (TYLENOL #3) 300-30 MG tablet; Take 2 tablets by mouth every 8 (eight) hours as needed. Reported on 12/15/2015 -     predniSONE (DELTASONE) 20 MG tablet; Take every morning with food 60 mg daily for 3 days, 40 mg daily for 3 days, 30 mg daily for 3 days, 20 mg daily for 3 days, 10 mg daily for 3 days then STOP -     Ambulatory referral to Neurology  Visit for screening mammogram -     MM DIGITAL SCREENING BILATERAL; Future  Right ear impacted cerumen -     carbamide peroxide (DEBROX) 6.5 % otic solution; Place 5 drops into the right ear 2 (two) times daily.  Other orders -     aspirin 81 MG tablet; Take 1 tablet (81 mg total) by mouth every other day. Reported on 12/15/2015   F/u in 4 weeks for wrist pain and to discuss chronic pain  Dr. Adrian Blackwater

## 2016-05-30 NOTE — Progress Notes (Signed)
Subjective:  Patient ID: Mariah Martinez, female    DOB: 25-Apr-1956  Age: 60 y.o. MRN: [id number]  CC: Hand Pain and Wrist Pain   HPI Mariah Martinez has HTN, HLD, morbid obesity, depression, current smoker and carpal tunnel she presents for    1. Hand and wrist pain: she has pain in both hands and wrist. L wrist is worse over past 2 month. She had a fall onto her L outstretched hand two months ago. She did not present to care. She has dorsal wrist pain since then. She has R wrist pain for the past year. With tingling in 2nd and 3rd finger. This was consistent with carpal tunnel and improved with bracing and NSAID. She is most concerned about her L posterior wrist swelling.   2. Cramping: in RUQ, legs, sometimes hands. Comes and goes. RUQ cramping occurs daily and is relieved with flexeril. RUQ cramping started about 6 months after her gallbladder surgery. There is some nausea, dizziness and sweating associated with RUQ swelling. No fever or vomiting.   3. R ear fullness: no drainage or fever. No hearing loss.   4. HTN: taking HCTZ. She ran out a few days ago. She picked up a refill and started taking it today.   Social History  Substance Use Topics  . Smoking status: Current Every Day Smoker -- 0.75 packs/day for 20 years    Types: Cigarettes  . Smokeless tobacco: Never Used  . Alcohol Use: 0.6 oz/week    1 Cans of beer per week     Comment: Monthly.   Past Surgical History  Procedure Laterality Date  . Cholecystectomy N/A 03/01/2013    Procedure: LAPAROSCOPIC CHOLECYSTECTOMY;  Surgeon: Gwenyth Ober, MD;  Location: [facility];  Service: General;  Laterality: N/A;  . Shoulder surgery Right May 23,2016     Social History  Substance Use Topics  . Smoking status: Current Every Day Smoker -- 0.75 packs/day for 20 years    Types: Cigarettes  . Smokeless tobacco: Never Used  . Alcohol Use: 0.6 oz/week    1 Cans of beer per week     Comment: Monthly.    Outpatient  Prescriptions Prior to Visit  Medication Sig Dispense Refill  . acetaminophen-codeine (TYLENOL #3) 300-30 MG tablet Take 1 tablet by mouth every 8 (eight) hours as needed. Reported on 12/15/2015 60 tablet 0  . albuterol (PROVENTIL HFA;VENTOLIN HFA) 108 (90 Base) MCG/ACT inhaler Inhale 2 puffs into the lungs every 6 (six) hours as needed for shortness of breath. In the morning and at bedtime 54 g 3  . aspirin 81 MG tablet Take 81 mg by mouth daily. Reported on 12/15/2015    . cyclobenzaprine (FLEXERIL) 10 MG tablet Take 1 tablet (10 mg total) by mouth 3 (three) times daily as needed for muscle spasms. 30 tablet 2  . hydrochlorothiazide (MICROZIDE) 12.5 MG capsule Take 1 capsule (12.5 mg total) by mouth daily. 90 capsule 2  . atorvastatin (LIPITOR) 40 MG tablet Take 1 tablet (40 mg total) by mouth daily. (Patient not taking: Reported on 05/30/2016) 90 tablet 3  . CHONDROITIN SULFATE PO Take 1 tablet by mouth daily. Reported on 05/30/2016    . Multiple Vitamins-Minerals (MULTIVITAMIN WITH MINERALS) tablet Take 1 tablet by mouth daily. Reported on 05/30/2016     No facility-administered medications prior to visit.    ROS Review of Systems  Constitutional: Negative for fever and chills.  HENT: Positive for ear pain. Negative for ear discharge.   Eyes:  Negative for visual disturbance.  Respiratory: Negative for shortness of breath.   Cardiovascular: Negative for chest pain.  Gastrointestinal: Positive for nausea and abdominal pain. Negative for blood in stool.  Musculoskeletal: Positive for arthralgias. Negative for back pain.  Skin: Negative for rash.  Allergic/Immunologic: Negative for immunocompromised state.  Neurological: Positive for dizziness.  Hematological: Negative for adenopathy. Does not bruise/bleed easily.  Psychiatric/Behavioral: Negative for suicidal ideas and dysphoric mood.    Objective:  BP 147/98 mmHg  Pulse 84  Temp(Src) 98.6 F (37 C) (Oral)  Ht 5\' 8"  (1.727 m)  Wt 271 lb  3.2 oz (123.016 kg)  BMI 41.25 kg/m2  SpO2 98%  LMP 09/27/2013  BP/Weight 05/30/2016 12/15/2015 [id number]  Systolic BP [id number] 0000000 0000000  Diastolic BP 98 83 80  Wt. (Lbs) 271.2 269 262  BMI 41.25 40.91 41.03   Physical Exam  Constitutional: She is oriented to person, place, and time. She appears well-developed and well-nourished. No distress.  HENT:  Head: Normocephalic and atraumatic.  Right Ear: Tympanic membrane and external ear normal.  Cerumen impacting R ear   Cardiovascular: Normal rate, regular rhythm, normal heart sounds and intact distal pulses.   Pulmonary/Chest: Effort normal and breath sounds normal.  Abdominal: Soft. Bowel sounds are normal. She exhibits no distension and no mass. There is tenderness. There is no rebound and no guarding.  Obese abdomen RUQ tenderness, slightly Pain worse with abdominal flexion   Musculoskeletal: She exhibits no edema.  Neurological: She is alert and oriented to person, place, and time.  Skin: Skin is warm and dry. No rash noted.  Psychiatric: She has a normal mood and affect.    Lab Results  Component Value Date   HGBA1C 5.6 05/30/2016   Assessment & Plan:  Mariah Martinez was seen today for hand pain and wrist pain.  Diagnoses and all orders for this visit:  Bilateral chronic knee pain -     acetaminophen-codeine (TYLENOL #3) 300-30 MG tablet; Take 2 tablets by mouth every 8 (eight) hours as needed. Reported on 12/15/2015  Abdominal wall pain in right upper quadrant -     cyclobenzaprine (FLEXERIL) 10 MG tablet; Take 1 tablet (10 mg total) by mouth 3 (three) times daily as needed for muscle spasms.  Morbid obesity with BMI of 40.0-44.9, adult (HCC)  Muscle cramping -     cyclobenzaprine (FLEXERIL) 10 MG tablet; Take 1 tablet (10 mg total) by mouth 3 (three) times daily as needed for muscle spasms. -     Cancel: BASIC METABOLIC PANEL WITH GFR -     HgB A1c -     Vitamin D, 25-hydroxy -     Vitamin B12 -     BASIC METABOLIC PANEL WITH  GFR  Bilateral wrist pain -     DG Wrist Complete Left; Future -     DG Wrist Complete Right; Future -     acetaminophen-codeine (TYLENOL #3) 300-30 MG tablet; Take 2 tablets by mouth every 8 (eight) hours as needed. Reported on 12/15/2015 -     predniSONE (DELTASONE) 20 MG tablet; Take every morning with food 60 mg daily for 3 days, 40 mg daily for 3 days, 30 mg daily for 3 days, 20 mg daily for 3 days, 10 mg daily for 3 days then STOP -     Ambulatory referral to Neurology  Visit for screening mammogram -     MM DIGITAL SCREENING BILATERAL; Future  Right ear impacted cerumen -     carbamide  peroxide (DEBROX) 6.5 % otic solution; Place 5 drops into the right ear 2 (two) times daily.  Other orders -     aspirin 81 MG tablet; Take 1 tablet (81 mg total) by mouth every other day. Reported on 12/15/2015   There are no diagnoses linked to this encounter.  No orders of the defined types were placed in this encounter.    Follow-up: Return in about 4 weeks (around 06/27/2016) for wrist pain .   Boykin Nearing MD

## 2016-05-30 NOTE — Assessment & Plan Note (Signed)
Exam consistent with abdominal wall pain  Refilled flexeril Checking electrolytes to ro low K+ A1c normal

## 2016-05-30 NOTE — Assessment & Plan Note (Signed)
Suspect b/l wrist carpal tunnel X-ray to rule out fracture in L wrist given hx of fall  Course of prednisone Neuro referral for nerve conduction studies

## 2016-05-31 ENCOUNTER — Ambulatory Visit: Payer: Self-pay

## 2016-06-03 ENCOUNTER — Telehealth: Payer: Self-pay

## 2016-06-03 NOTE — Telephone Encounter (Signed)
Received call from rep "Helene Kelp" requesting return call to [phone] regarding pending labs showing released 05/30/16. Per [facility] should have received specimen 05/30/16

## 2016-06-04 ENCOUNTER — Other Ambulatory Visit: Payer: Self-pay | Admitting: *Deleted

## 2016-06-04 DIAGNOSIS — M25532 Pain in left wrist: Principal | ICD-10-CM

## 2016-06-04 DIAGNOSIS — M25531 Pain in right wrist: Secondary | ICD-10-CM

## 2016-06-04 NOTE — Telephone Encounter (Signed)
This message is unclear. I have never seen the patient before.

## 2016-06-05 ENCOUNTER — Ambulatory Visit: Payer: Self-pay | Attending: Family Medicine

## 2016-06-05 ENCOUNTER — Ambulatory Visit (INDEPENDENT_AMBULATORY_CARE_PROVIDER_SITE_OTHER): Payer: Self-pay | Admitting: Neurology

## 2016-06-05 DIAGNOSIS — M25532 Pain in left wrist: Secondary | ICD-10-CM

## 2016-06-05 DIAGNOSIS — M25531 Pain in right wrist: Secondary | ICD-10-CM

## 2016-06-05 DIAGNOSIS — M25562 Pain in left knee: Secondary | ICD-10-CM | POA: Insufficient documentation

## 2016-06-05 DIAGNOSIS — G8929 Other chronic pain: Secondary | ICD-10-CM | POA: Insufficient documentation

## 2016-06-05 DIAGNOSIS — M25561 Pain in right knee: Secondary | ICD-10-CM | POA: Insufficient documentation

## 2016-06-05 DIAGNOSIS — R252 Cramp and spasm: Secondary | ICD-10-CM | POA: Insufficient documentation

## 2016-06-05 LAB — VITAMIN B12: VITAMIN B 12: 842 pg/mL (ref 200–1100)

## 2016-06-05 NOTE — Procedures (Signed)
[facility]  [address], [address]  [place], [place] [address] Tel: [phone] Fax:  [phone] Test Date:  06/05/2016  Patient: Mariah Martinez DOB: May 30, 1956 Physician: Narda Amber, DO  Sex: Female Height: 5\' 8"  Ref Phys: Army Chaco, M.D.  ID#: [id number]   Technician: Jerilynn Mages. Dean   Patient Complaints: This is a 60 year old female referred for evaluation of bilateral hand numbness, swelling, and weakness.  NCV & EMG Findings: Extensive electrodiagnostic testing of the right upper extremity and additional studies of the left shows: 1. Bilateral median, ulnar, and palmar sensory responses are within normal limits. 2. Bilateral median and ulnar motor responses are within normal limits. 3. There is no evidence of active or chronic motor axon loss changes affecting any of the tested muscles. Motor unit configuration and recruitment pattern is within normal limits.  Impression: This is a normal study of the upper extremities.  In particular, there is no evidence of a cervical radiculopathy or carpal tunnel syndrome.   ___________________________ Narda Amber, DO    Nerve Conduction Studies Anti Sensory Summary Table   Stim Site NR Peak (ms) Norm Peak (ms) P-T Amp (V) Norm P-T Amp  Left Median Anti Sensory (2nd Digit)  32.6C  Wrist    3.1 <3.8 41.5 >10  Right Median Anti Sensory (2nd Digit)  32.6C  Wrist    3.3 <3.8 40.5 >10  Left Ulnar Anti Sensory (5th Digit)  32.6C  Site 2    2.8  31.4   Right Ulnar Anti Sensory (5th Digit)  32.6C  Wrist    3.1 <3.2 32.0 >5   Motor Summary Table   Stim Site NR Onset (ms) Norm Onset (ms) O-P Amp (mV) Norm O-P Amp Site1 Site2 Delta-0 (ms) Dist (cm) Vel (m/s) Norm Vel (m/s)  Left Median Motor (Abd Poll Brev)  Wrist    3.2 <4.0 8.1 >5 Elbow Wrist 4.1 24.0 59 >50  Elbow    7.3  7.6         Right Median Motor (Abd Poll Brev)  32.6C  Wrist    3.0 <4.0 9.1 >5 Elbow Wrist 4.7 25.0 53 >50  Elbow    7.7  8.7           Left Ulnar Motor (Abd Dig Minimi)  Wrist    2.7 <3.1 10.3 >7 B Elbow Wrist 4.4 23.0 52 >50  B Elbow    7.1  9.6  A Elbow B Elbow 1.8 10.0 56 >50  A Elbow    8.9  9.1         Right Ulnar Motor (Abd Dig Minimi)  32.6C  Wrist    2.8 <3.1 9.9 >7 B Elbow Wrist 4.4 23.0 52 >50  B Elbow    7.2  8.6  A Elbow B Elbow 1.7 10.0 59 >50  A Elbow    8.9  8.7          Comparison Summary Table   Stim Site NR Peak (ms) Norm Peak (ms) P-T Amp (V) Site1 Site2 Delta-P (ms) Norm Delta (ms)  Left Median/Ulnar Palm Comparison (Wrist - 8cm)  Median Palm    1.9 <2.2 66.1 Median Palm Ulnar Palm 0.0   Ulnar Palm    1.9 <2.2 18.3      Right Median/Ulnar Palm Comparison (Wrist - 8cm)  32.6C  Median Palm    2.0 <2.2 40.4 Median Palm Ulnar Palm 0.2   Ulnar Palm    1.8 <2.2 21.2  EMG   Side Muscle Ins Act Fibs Psw Fasc Number Recrt Dur Dur. Amp Amp. Poly Poly. Comment  Right 1stDorInt Nml Nml Nml Nml Nml Nml Nml Nml Nml Nml Nml Nml N/A  Right Ext Indicis Nml Nml Nml Nml Nml Nml Nml Nml Nml Nml Nml Nml N/A  Right PronatorTeres Nml Nml Nml Nml Nml Nml Nml Nml Nml Nml Nml Nml N/A  Right Biceps Nml Nml Nml Nml Nml Nml Nml Nml Nml Nml Nml Nml N/A  Right Triceps Nml Nml Nml Nml Nml Nml Nml Nml Nml Nml Nml Nml N/A  Right Deltoid Nml Nml Nml Nml Nml Nml Nml Nml Nml Nml Nml Nml N/A  Left 1stDorInt Nml Nml Nml Nml Nml Nml Nml Nml Nml Nml Nml Nml N/A  Left Ext Indicis Nml Nml Nml Nml Nml Nml Nml Nml Nml Nml Nml Nml N/A  Left PronatorTeres Nml Nml Nml Nml Nml Nml Nml Nml Nml Nml Nml Nml N/A  Left Biceps Nml Nml Nml Nml Nml Nml Nml Nml Nml Nml Nml Nml N/A  Left Triceps Nml Nml Nml Nml Nml Nml Nml Nml Nml Nml Nml Nml N/A  Left Deltoid Nml Nml Nml Nml Nml Nml Nml Nml Nml Nml Nml Nml N/A      Waveforms:

## 2016-06-05 NOTE — Addendum Note (Signed)
Addended by: Tommas Olp B on: 06/05/2016 12:28 PM   Modules accepted: Orders

## 2016-06-06 LAB — BASIC METABOLIC PANEL WITH GFR
BUN: 23 mg/dL (ref 7–25)
CHLORIDE: 106 mmol/L (ref 98–110)
CO2: 23 mmol/L (ref 20–31)
Calcium: 9.3 mg/dL (ref 8.6–10.4)
Creat: 0.89 mg/dL (ref 0.50–0.99)
GFR, EST NON AFRICAN AMERICAN: 71 mL/min (ref >=60)
GFR, Est African American: 81 mL/min (ref >=60)
Glucose, Bld: 150 mg/dL — ABNORMAL HIGH (ref 65–99)
Potassium: 3.9 mmol/L (ref 3.5–5.3)
SODIUM: 140 mmol/L (ref 135–146)

## 2016-06-06 LAB — VITAMIN D 25 HYDROXY (VIT D DEFICIENCY, FRACTURES): VIT D 25 HYDROXY: 26 ng/mL — AB (ref 30–100)

## 2016-06-06 NOTE — Telephone Encounter (Signed)
Lab requests has been completed and no further action needed.

## 2016-06-11 ENCOUNTER — Ambulatory Visit (HOSPITAL_COMMUNITY)
Admission: RE | Admit: 2016-06-11 | Discharge: 2016-06-11 | Disposition: A | Discharge status: Home / Self Care | Payer: Self-pay | Source: Ambulatory Visit | Attending: Family Medicine | Admitting: Family Medicine

## 2016-06-11 DIAGNOSIS — M25532 Pain in left wrist: Secondary | ICD-10-CM | POA: Insufficient documentation

## 2016-06-11 DIAGNOSIS — M25531 Pain in right wrist: Secondary | ICD-10-CM | POA: Insufficient documentation

## 2016-06-25 MED FILL — ?HYDROCHLOROTHIAZIDE 12.5MG: 12.5 | 30 days supply | Qty: 30 | Fill #5

## 2016-07-11 ENCOUNTER — Ambulatory Visit: Payer: Self-pay | Attending: Family Medicine | Admitting: Family Medicine

## 2016-07-11 ENCOUNTER — Encounter: Payer: Self-pay | Admitting: Family Medicine

## 2016-07-11 VITALS — BP 139/98 | HR 80 | Temp 97.9°F | Ht 68.0 in | Wt 272.4 lb

## 2016-07-11 DIAGNOSIS — I10 Essential (primary) hypertension: Secondary | ICD-10-CM

## 2016-07-11 DIAGNOSIS — F17210 Nicotine dependence, cigarettes, uncomplicated: Secondary | ICD-10-CM | POA: Insufficient documentation

## 2016-07-11 DIAGNOSIS — F329 Major depressive disorder, single episode, unspecified: Secondary | ICD-10-CM | POA: Insufficient documentation

## 2016-07-11 DIAGNOSIS — R799 Abnormal finding of blood chemistry, unspecified: Secondary | ICD-10-CM

## 2016-07-11 DIAGNOSIS — Z79899 Other long term (current) drug therapy: Secondary | ICD-10-CM | POA: Insufficient documentation

## 2016-07-11 DIAGNOSIS — M25531 Pain in right wrist: Secondary | ICD-10-CM

## 2016-07-11 DIAGNOSIS — Z7982 Long term (current) use of aspirin: Secondary | ICD-10-CM | POA: Insufficient documentation

## 2016-07-11 DIAGNOSIS — M25539 Pain in unspecified wrist: Secondary | ICD-10-CM

## 2016-07-11 DIAGNOSIS — M25532 Pain in left wrist: Secondary | ICD-10-CM

## 2016-07-11 DIAGNOSIS — G8929 Other chronic pain: Secondary | ICD-10-CM

## 2016-07-11 LAB — RHEUMATOID FACTOR: Rhuematoid fact SerPl-aCnc: 101 IU/mL — ABNORMAL HIGH (ref <=14)

## 2016-07-11 MED ORDER — MELOXICAM 7.5 MG PO TABS: 7.5000 mg | ORAL_TABLET | Freq: Every day | ORAL | 3 refills | Status: DC

## 2016-07-11 MED ORDER — HYDROCHLOROTHIAZIDE 25 MG PO TABS: 25.0000 mg | ORAL_TABLET | Freq: Every day | ORAL | 3 refills | Status: DC

## 2016-07-11 NOTE — Assessment & Plan Note (Addendum)
B/l wrist pain concerning for carpal tunnel with normal nerve conduction studies and x-rays. Pain was improved with prednisone.   Plan: mobic RA, ANA

## 2016-07-11 NOTE — Patient Instructions (Addendum)
Mariah Martinez was seen today for follow-up.  Diagnoses and all orders for this visit:  Chronic wrist pain, unspecified laterality -     ANA,IFA RA Diag Pnl w/rflx Tit/Patn -     Rheumatoid factor -     meloxicam (MOBIC) 7.5 MG tablet; Take 1 tablet (7.5 mg total) by mouth daily.  Bilateral wrist pain  Essential hypertension -     hydrochlorothiazide (HYDRODIURIL) 25 MG tablet; Take 1 tablet (25 mg total) by mouth daily.  BP has been elevated on last two OV, increase HCTZ to 25 mg daily.   Work to quit smoking and reduce weight to help with BP and chronic pains.   Smoking cessation support: smoking cessation hotline: 1-800-QUIT-NOW.  Smoking cessation classes are available through [facility]. Call 303-265-6390 or visit our website at [email or web address]   Please call Rolena Infante, [phone],  with the BCCCP (breast and cervical cancer control program) at the [facility] to set up an appointment to verify eligibility for a breast exam, mammogram, ultrasound. If you qualify this will be set up at [facility].  F/u in 3 months for HTN  Dr. Adrian Blackwater

## 2016-07-11 NOTE — Progress Notes (Signed)
C/C: F/U on wrist pain Pt pain level is 5 on wrist

## 2016-07-11 NOTE — Assessment & Plan Note (Signed)
A: HTN with elevated DBP in smoker with obesity  Med: compliant P: Increased HCTZ from 12.5 to 25 mg daily

## 2016-07-11 NOTE — Progress Notes (Signed)
Subjective:  Patient ID: Mariah Martinez, female    DOB: 1956-09-08  Age: 60 y.o. MRN: [id number]  CC: Follow-up (wrist pain)   HPI Corinne Carrigan has HTN, HLD, morbid obesity, depression, current smoker and wrist pains she presents for    1. Hand and wrist pain: she has pain in both hands and wrist. L wrist is worse over past 4 month. She had a fall onto her L outstretched hand 4 months ago. She has dorsal wrist pain since then. She has R wrist pain for the past year. With tingling in 2nd and 3rd finger. L worse that R. She has negative nerve conduction test in both upper extremities in 06/05/2016. She has normal wrist x-rays on 06/11/2016. She took a course of prednisone following her last OV that helped reduce her pain levels. She reports pains in her feet also. Sometimes she has diffuse body aches in with pains in her eyelids when she blinks.   2. R ear impaction: taking debrox. Less fullness in R ear.   3. HTN: she ate pork ribs last night. She is smoking. She is not exercising. Has HA today. No CP or SOB.   Social History  Substance Use Topics  . Smoking status: Current Every Day Smoker    Packs/day: 0.75    Years: 20.00    Types: Cigarettes  . Smokeless tobacco: Never Used  . Alcohol use 0.6 oz/week    1 Cans of beer per week     Comment: Monthly.   Past Surgical History:  Procedure Laterality Date  . CHOLECYSTECTOMY N/A 03/01/2013   Procedure: LAPAROSCOPIC CHOLECYSTECTOMY;  Surgeon: Gwenyth Ober, MD;  Location: [facility];  Service: General;  Laterality: N/A;  . SHOULDER SURGERY Right May 23,2016     Social History  Substance Use Topics  . Smoking status: Current Every Day Smoker    Packs/day: 0.75    Years: 20.00    Types: Cigarettes  . Smokeless tobacco: Never Used  . Alcohol use 0.6 oz/week    1 Cans of beer per week     Comment: Monthly.    Outpatient Medications Prior to Visit  Medication Sig Dispense Refill  . acetaminophen-codeine (TYLENOL #3) 300-30  MG tablet Take 2 tablets by mouth every 8 (eight) hours as needed. Reported on 12/15/2015 60 tablet 2  . albuterol (PROVENTIL HFA;VENTOLIN HFA) 108 (90 Base) MCG/ACT inhaler Inhale 2 puffs into the lungs every 6 (six) hours as needed for shortness of breath. In the morning and at bedtime 54 g 3  . aspirin 81 MG tablet Take 1 tablet (81 mg total) by mouth every other day. Reported on 12/15/2015 30 tablet   . cyclobenzaprine (FLEXERIL) 10 MG tablet Take 1 tablet (10 mg total) by mouth 3 (three) times daily as needed for muscle spasms. 30 tablet 2  . hydrochlorothiazide (MICROZIDE) 12.5 MG capsule Take 1 capsule (12.5 mg total) by mouth daily. 90 capsule 2  . predniSONE (DELTASONE) 20 MG tablet Take every morning with food 60 mg daily for 3 days, 40 mg daily for 3 days, 30 mg daily for 3 days, 20 mg daily for 3 days, 10 mg daily for 3 days then STOP 24 tablet 0  . atorvastatin (LIPITOR) 40 MG tablet Take 1 tablet (40 mg total) by mouth daily. (Patient not taking: Reported on 05/30/2016) 90 tablet 3  . carbamide peroxide (DEBROX) 6.5 % otic solution Place 5 drops into the right ear 2 (two) times daily. (Patient not taking: Reported  on 07/11/2016) 15 mL 0  . CHONDROITIN SULFATE PO Take 1 tablet by mouth daily. Reported on 05/30/2016    . Multiple Vitamins-Minerals (MULTIVITAMIN WITH MINERALS) tablet Take 1 tablet by mouth daily. Reported on 05/30/2016     No facility-administered medications prior to visit.     ROS Review of Systems  Constitutional: Negative for chills and fever.  HENT: Negative for ear discharge and ear pain.   Eyes: Negative for visual disturbance.  Respiratory: Negative for shortness of breath.   Cardiovascular: Negative for chest pain.  Gastrointestinal: Positive for abdominal pain. Negative for blood in stool and nausea.  Musculoskeletal: Positive for arthralgias. Negative for back pain.  Skin: Negative for rash.  Allergic/Immunologic: Negative for immunocompromised state.    Neurological: Positive for headaches. Negative for dizziness.  Hematological: Negative for adenopathy. Does not bruise/bleed easily.  Psychiatric/Behavioral: Negative for dysphoric mood and suicidal ideas.    Objective:  BP (!) 139/98 (BP Location: Right Arm, Patient Position: Sitting)   Pulse 80   Temp 97.9 F (36.6 C) (Oral)   Ht 5\' 8"  (1.727 m)   Wt 272 lb 6.4 oz (123.6 kg)   LMP 09/27/2013 Comment: irregular  SpO2 97%   BMI 41.42 kg/m   BP/Weight 07/11/2016 Q000111Q [id number]  Systolic BP [id number] Q000111Q 0000000  Diastolic BP 98 98 83  Wt. (Lbs) 272.4 271.2 269  BMI 41.42 41.25 40.91   Physical Exam  Constitutional: She is oriented to person, place, and time. She appears well-developed and well-nourished. No distress.  HENT:  Head: Normocephalic and atraumatic.  Right Ear: Tympanic membrane and external ear normal.  Cerumen impacting R ear   Cardiovascular: Normal rate, regular rhythm, normal heart sounds and intact distal pulses.   Pulmonary/Chest: Effort normal and breath sounds normal.  Musculoskeletal: She exhibits no edema.       Right wrist: Normal.       Left wrist: Normal.       Feet:  Neurological: She is alert and oriented to person, place, and time.  Skin: Skin is warm and dry. No rash noted.  Psychiatric: She has a normal mood and affect.    Lab Results  Component Value Date   HGBA1C 5.6 05/30/2016   Assessment & Plan:  Allia was seen today for follow-up.  Diagnoses and all orders for this visit:  Chronic wrist pain, unspecified laterality -     ANA,IFA RA Diag Pnl w/rflx Tit/Patn -     Rheumatoid factor -     meloxicam (MOBIC) 7.5 MG tablet; Take 1 tablet (7.5 mg total) by mouth daily.  Bilateral wrist pain   There are no diagnoses linked to this encounter.  No orders of the defined types were placed in this encounter.   Follow-up: No Follow-up on file.   Boykin Nearing MD

## 2016-07-12 LAB — ANA,IFA RA DIAG PNL W/RFLX TIT/PATN
ANA: NEGATIVE
Cyclic Citrullin Peptide Ab: >250 Units — ABNORMAL HIGH

## 2016-07-16 MED FILL — ACETAMINOPHEN/COD #3 TABLET: 300-30 | 10 days supply | Qty: 60 | Fill #1

## 2016-07-16 MED FILL — HYDROCHLOROTHIAZIDE 25 MG T: 25 | 30 days supply | Qty: 30 | Fill #0

## 2016-07-16 MED FILL — MELOXICAM 7.5 MG TABLET: 7.5 | 30 days supply | Qty: 30 | Fill #0

## 2016-07-17 DIAGNOSIS — R799 Abnormal finding of blood chemistry, unspecified: Secondary | ICD-10-CM | POA: Insufficient documentation

## 2016-07-17 NOTE — Addendum Note (Signed)
Addended by: Boykin Nearing on: 07/17/2016 02:01 PM   Modules accepted: Orders

## 2016-08-16 MED FILL — HYDROCHLOROTHIAZIDE 25 MG T: 25 | 30 days supply | Qty: 30 | Fill #1

## 2016-08-21 ENCOUNTER — Encounter (HOSPITAL_COMMUNITY): Payer: Self-pay | Admitting: Family Medicine

## 2016-08-21 ENCOUNTER — Telehealth: Payer: Self-pay | Admitting: Family Medicine

## 2016-08-21 ENCOUNTER — Ambulatory Visit (HOSPITAL_COMMUNITY)
Admission: EM | Admit: 2016-08-21 | Discharge: 2016-08-21 | Disposition: A | Discharge status: Home / Self Care | Payer: Self-pay | Attending: Internal Medicine | Admitting: Internal Medicine

## 2016-08-21 DIAGNOSIS — M05732 Rheumatoid arthritis with rheumatoid factor of left wrist without organ or systems involvement: Secondary | ICD-10-CM

## 2016-08-21 DIAGNOSIS — M05731 Rheumatoid arthritis with rheumatoid factor of right wrist without organ or systems involvement: Secondary | ICD-10-CM

## 2016-08-21 NOTE — Telephone Encounter (Signed)
Patient called to see PCP because of pain and swelling on left wrist. PCP is not in the office today and there are no appts available. Advised pt to go to Urgent Care.    Thank you

## 2016-08-21 NOTE — Discharge Instructions (Signed)
Your wrist pain is a chronic pain for rheumatoid arthritis. Unfortunately there isn't a lot that we can do here in the urgent care to help you except to provide you some pain medication however you declined the prescription. I would suggest you to follow up with a rheumatologist. Also suggest you to make an appointment with Dr. Adrian Blackwater to discuss pain management and referral to rheumatologist.

## 2016-08-21 NOTE — ED Provider Notes (Signed)
CSN: [id number]     Arrival date & time 08/21/16  1150 History   First MD Initiated Contact with Patient 08/21/16 1335     Chief Complaint  Patient presents with  . Hand Pain  . Wrist Pain   (Consider location/radiation/quality/duration/timing/severity/associated sxs/prior Treatment) Mrs. Coyne is a well-appearing 60 y.o female, presents today for bilateral wrist pain (Left worst than left) and wrist swelling. She reports to have chronic wrist pain for years. She reports the swelling have been present for 4 days, however she has swelling of her both wrist on and off for the past few months. She reports lost in her strength and reports that she cannot pick things up. She reports to have had xray of her wrist done a few weeks ago and was negatives. Her PCP is Dr. Adrian Blackwater and Dr. Adrian Blackwater started her on Tylenol #3 and meloxicam 7.5 however she reports no relief. She rates her pain as 10/10 currently. She reports limited ROM. She was told that she may have RA.       Past Medical History:  Diagnosis Date  . Arthritis Dx 2008  . Diverticulosis of colon (without mention of hemorrhage) 07/10/2011  . Fibromyalgia Dx 2008  . Hyperlipidemia Dx 2010  . Hypertension Dx 1995   Past Surgical History:  Procedure Laterality Date  . CHOLECYSTECTOMY N/A 03/01/2013   Procedure: LAPAROSCOPIC CHOLECYSTECTOMY;  Surgeon: Gwenyth Ober, MD;  Location: [facility] OR;  Service: General;  Laterality: N/A;  . SHOULDER SURGERY Right May 23,2016   Family History  Problem Relation Age of Onset  . Hypertension Mother   . Stroke Father   . Hypertension Sister   . Hypertension Brother   . Colon cancer Neg Hx   . Colon polyps Neg Hx   . Stomach cancer Neg Hx    Social History  Substance Use Topics  . Smoking status: Current Every Day Smoker    Packs/day: 0.75    Years: 20.00    Types: Cigarettes  . Smokeless tobacco: Never Used  . Alcohol use 0.6 oz/week    1 Cans of beer per week     Comment: Monthly.    OB History    No data available     Review of Systems  All other systems reviewed and are negative.   Allergies  Review of patient's allergies indicates no known allergies.  Home Medications   Prior to Admission medications   Medication Sig Start Date End Date Taking? Authorizing Provider  acetaminophen-codeine (TYLENOL #3) 300-30 MG tablet Take 2 tablets by mouth every 8 (eight) hours as needed. Reported on 12/15/2015 05/30/16   Boykin Nearing, MD  albuterol (PROVENTIL HFA;VENTOLIN HFA) 108 (90 Base) MCG/ACT inhaler Inhale 2 puffs into the lungs every 6 (six) hours as needed for shortness of breath. In the morning and at bedtime 12/21/15   Boykin Nearing, MD  aspirin 81 MG tablet Take 1 tablet (81 mg total) by mouth every other day. Reported on 12/15/2015 05/30/16   Boykin Nearing, MD  atorvastatin (LIPITOR) 40 MG tablet Take 1 tablet (40 mg total) by mouth daily. Patient not taking: Reported on 05/30/2016 12/15/15   Boykin Nearing, MD  CHONDROITIN SULFATE PO Take 1 tablet by mouth daily. Reported on 05/30/2016    Historical Provider, MD  cyclobenzaprine (FLEXERIL) 10 MG tablet Take 1 tablet (10 mg total) by mouth 3 (three) times daily as needed for muscle spasms. 05/30/16   Josalyn Funches, MD  hydrochlorothiazide (HYDRODIURIL) 25 MG tablet Take 1 tablet (  25 mg total) by mouth daily. 07/11/16   Josalyn Funches, MD  meloxicam (MOBIC) 7.5 MG tablet Take 1 tablet (7.5 mg total) by mouth daily. 07/11/16   Boykin Nearing, MD  Multiple Vitamins-Minerals (MULTIVITAMIN WITH MINERALS) tablet Take 1 tablet by mouth daily. Reported on 05/30/2016    Historical Provider, MD   Meds Ordered and Administered this Visit  Medications - No data to display  BP 164/94   Pulse (!) 58   Temp 98.2 F (36.8 C)   Resp 18   LMP 09/27/2013 Comment: irregular  SpO2 98%  No data found.   Physical Exam  Constitutional: She is oriented to person, place, and time. She appears well-developed and well-nourished.   HENT:  Head: Normocephalic and atraumatic.  Cardiovascular: Normal rate, regular rhythm and normal heart sounds.   Pulmonary/Chest: Effort normal and breath sounds normal. No respiratory distress. She has no wheezes.  Musculoskeletal:  Right wrist: No swelling, has good ROM, non-tender on palpation  Left wrist: Has minimal swelling, has limited ROM. Has pain with ROM. Sensation is intact.   Neurological: She is alert and oriented to person, place, and time.  Skin: Skin is warm and dry.    Urgent Care Course   Clinical Course    Procedures (including critical care time)  Labs Review Labs Reviewed - No data to display  Imaging Review No results found.   MDM   1. 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 Bilateral wrist xray on 06/11/16 are both negative. Labs on 07/11/2016 showed +RA and +CCP  2) Patient informed that she has Rheumatoid arthritis. Education provided. Informed that this is a chronic condition.   3) Dr. Adrian Blackwater has her on Tylenol #3 and Mexlociam 7.5. I offered to increase her Meloxicam to 15mg , which she doesn't want to. I offered her Tramadol prescription and she declined  4) I also offered her referral to rheumatology referral; patient states that she will see Dr. Adrian Blackwater for better pain management and will get Dr. Adrian Blackwater to refer her.   5) No repeat in xray done today as I don't think this is necessary.   6) Patient request for a wrist splint on her left hand to help with immobilization; splint given per request.       Barry Dienes, NP 08/22/16 1338

## 2016-08-21 NOTE — ED Triage Notes (Signed)
Pt here for bilateral pain and swelling to hands and wrist. sts was told she may have RA.

## 2016-09-16 MED FILL — HYDROCHLOROTHIAZIDE 25 MG T: 25 | 30 days supply | Qty: 30 | Fill #2

## 2016-09-17 MED FILL — ?CYCLOBENZAPRINE 10 MG TABL: 10 | 10 days supply | Qty: 30 | Fill #1

## 2016-10-07 ENCOUNTER — Ambulatory Visit: Payer: Self-pay | Attending: Family Medicine

## 2016-10-10 ENCOUNTER — Encounter: Payer: Self-pay | Admitting: Family Medicine

## 2016-10-10 ENCOUNTER — Ambulatory Visit: Payer: Self-pay | Attending: Family Medicine | Admitting: Family Medicine

## 2016-10-10 VITALS — BP 129/82 | HR 77 | Temp 98.1°F | Ht 68.0 in | Wt 268.6 lb

## 2016-10-10 DIAGNOSIS — R252 Cramp and spasm: Secondary | ICD-10-CM | POA: Insufficient documentation

## 2016-10-10 DIAGNOSIS — F17210 Nicotine dependence, cigarettes, uncomplicated: Secondary | ICD-10-CM | POA: Insufficient documentation

## 2016-10-10 DIAGNOSIS — I10 Essential (primary) hypertension: Secondary | ICD-10-CM | POA: Insufficient documentation

## 2016-10-10 DIAGNOSIS — M25539 Pain in unspecified wrist: Secondary | ICD-10-CM | POA: Insufficient documentation

## 2016-10-10 DIAGNOSIS — R799 Abnormal finding of blood chemistry, unspecified: Secondary | ICD-10-CM

## 2016-10-10 DIAGNOSIS — R1011 Right upper quadrant pain: Secondary | ICD-10-CM | POA: Insufficient documentation

## 2016-10-10 DIAGNOSIS — Z79899 Other long term (current) drug therapy: Secondary | ICD-10-CM | POA: Insufficient documentation

## 2016-10-10 DIAGNOSIS — Z9889 Other specified postprocedural states: Secondary | ICD-10-CM | POA: Insufficient documentation

## 2016-10-10 DIAGNOSIS — Z0001 Encounter for general adult medical examination with abnormal findings: Secondary | ICD-10-CM | POA: Insufficient documentation

## 2016-10-10 DIAGNOSIS — Z7982 Long term (current) use of aspirin: Secondary | ICD-10-CM | POA: Insufficient documentation

## 2016-10-10 DIAGNOSIS — Z9049 Acquired absence of other specified parts of digestive tract: Secondary | ICD-10-CM | POA: Insufficient documentation

## 2016-10-10 MED ORDER — HYDROCHLOROTHIAZIDE 25 MG PO TABS: 25.0000 mg | ORAL_TABLET | Freq: Every day | ORAL | 3 refills | Status: AC

## 2016-10-10 MED ORDER — PREDNISONE 10 MG PO TABS: ORAL_TABLET | ORAL | 0 refills | Status: AC

## 2016-10-10 MED ORDER — CYCLOBENZAPRINE HCL 10 MG PO TABS: 10.0000 mg | ORAL_TABLET | Freq: Three times a day (TID) | ORAL | 2 refills | Status: AC | PRN

## 2016-10-10 MED FILL — predniSONE 10 MG TABS: 10 | 90 days supply | Qty: 90 | Fill #0

## 2016-10-10 MED FILL — HYDROCHLOROTHIAZIDE 25 MG T: 25 | 90 days supply | Qty: 90 | Fill #0

## 2016-10-10 MED FILL — CYCLOBENZAPRINE 10 MG TAB: 10 | 10 days supply | Qty: 30 | Fill #0

## 2016-10-10 NOTE — Progress Notes (Signed)
Subjective:  Patient ID: Mariah Martinez, female    DOB: 1956/09/12  Age: 60 y.o. MRN: [id number]  CC: Wrist Pain   HPI Mariah Martinez has HTN, HLD, morbid obesity, depression, current smoker and wrist pains she presents for    1. Rheumatoid arthritis: elevated rheumatoid factor. Having pain in wrist. Has not yet established with rheumatology. Planning to travel next month and will be away for 2 months.   2. HTN: taking HCTZ. No HA, CP or SOB.   Social History  Substance Use Topics  . Smoking status: Current Every Day Smoker    Packs/day: 0.75    Years: 20.00    Types: Cigarettes  . Smokeless tobacco: Never Used  . Alcohol use 0.6 oz/week    1 Cans of beer per week     Comment: Monthly.   Past Surgical History:  Procedure Laterality Date  . CHOLECYSTECTOMY N/A 03/01/2013   Procedure: LAPAROSCOPIC CHOLECYSTECTOMY;  Surgeon: Gwenyth Ober, MD;  Location: [facility];  Service: General;  Laterality: N/A;  . SHOULDER SURGERY Right May 23,2016     Social History  Substance Use Topics  . Smoking status: Current Every Day Smoker    Packs/day: 0.75    Years: 20.00    Types: Cigarettes  . Smokeless tobacco: Never Used  . Alcohol use 0.6 oz/week    1 Cans of beer per week     Comment: Monthly.    Outpatient Medications Prior to Visit  Medication Sig Dispense Refill  . acetaminophen-codeine (TYLENOL #3) 300-30 MG tablet Take 2 tablets by mouth every 8 (eight) hours as needed. Reported on 12/15/2015 60 tablet 2  . albuterol (PROVENTIL HFA;VENTOLIN HFA) 108 (90 Base) MCG/ACT inhaler Inhale 2 puffs into the lungs every 6 (six) hours as needed for shortness of breath. In the morning and at bedtime 54 g 3  . aspirin 81 MG tablet Take 1 tablet (81 mg total) by mouth every other day. Reported on 12/15/2015 30 tablet   . cyclobenzaprine (FLEXERIL) 10 MG tablet Take 1 tablet (10 mg total) by mouth 3 (three) times daily as needed for muscle spasms. 30 tablet 2  . hydrochlorothiazide  (HYDRODIURIL) 25 MG tablet Take 1 tablet (25 mg total) by mouth daily. 90 tablet 3  . meloxicam (MOBIC) 7.5 MG tablet Take 1 tablet (7.5 mg total) by mouth daily. 30 tablet 3  . atorvastatin (LIPITOR) 40 MG tablet Take 1 tablet (40 mg total) by mouth daily. (Patient not taking: Reported on 10/10/2016) 90 tablet 3  . CHONDROITIN SULFATE PO Take 1 tablet by mouth daily. Reported on 05/30/2016    . Multiple Vitamins-Minerals (MULTIVITAMIN WITH MINERALS) tablet Take 1 tablet by mouth daily. Reported on 05/30/2016     No facility-administered medications prior to visit.     ROS Review of Systems  Constitutional: Negative for chills and fever.  HENT: Negative for ear discharge and ear pain.   Eyes: Negative for visual disturbance.  Respiratory: Negative for shortness of breath.   Cardiovascular: Negative for chest pain.  Gastrointestinal: Positive for abdominal pain. Negative for blood in stool and nausea.  Musculoskeletal: Positive for arthralgias. Negative for back pain.  Skin: Negative for rash.  Allergic/Immunologic: Negative for immunocompromised state.  Neurological: Positive for headaches. Negative for dizziness.  Hematological: Negative for adenopathy. Does not bruise/bleed easily.  Psychiatric/Behavioral: Negative for dysphoric mood and suicidal ideas.    Objective:  BP 129/82 (BP Location: Left Arm, Patient Position: Sitting, Cuff Size: Large)   Pulse 77  Temp 98.1 F (36.7 C) (Oral)   Ht 5\' 8"  (1.727 m)   Wt 268 lb 9.6 oz (121.8 kg)   LMP 09/27/2013 Comment: irregular  SpO2 95%   BMI 40.84 kg/m   BP/Weight 10/10/2016 08/21/2016 0000000  Systolic BP Q000111Q [id number] [id number]  Diastolic BP 82 94 98  Wt. (Lbs) 268.6 - 272.4  BMI 40.84 - 41.42   Physical Exam  Constitutional: She is oriented to person, place, and time. She appears well-developed and well-nourished. No distress.  HENT:  Head: Normocephalic and atraumatic.  Right Ear: Tympanic membrane normal.  Cardiovascular:  Normal rate, regular rhythm, normal heart sounds and intact distal pulses.   Pulmonary/Chest: Effort normal and breath sounds normal.  Abdominal:    Musculoskeletal: She exhibits no edema.       Right wrist: Normal.       Left wrist: Normal.       Feet:  Neurological: She is alert and oriented to person, place, and time.  Skin: Skin is warm and dry. No rash noted.  Psychiatric: She has a normal mood and affect.    Lab Results  Component Value Date   HGBA1C 5.6 05/30/2016   Assessment & Plan:  Mariah Martinez was seen today for wrist pain.  Diagnoses and all orders for this visit:  Elevated result in multi-biomarker disease activity panel for rheumatoid arthritis -     Ambulatory referral to Rheumatology -     predniSONE (DELTASONE) 10 MG tablet; Take by mouth with breakfast 40 mg daily for 3 days, then 20 mg daily for 3 days, then 10 mg daily  Essential hypertension -     hydrochlorothiazide (HYDRODIURIL) 25 MG tablet; Take 1 tablet (25 mg total) by mouth daily.  Abdominal wall pain in right upper quadrant -     cyclobenzaprine (FLEXERIL) 10 MG tablet; Take 1 tablet (10 mg total) by mouth 3 (three) times daily as needed for muscle spasms.  Muscle cramping -     cyclobenzaprine (FLEXERIL) 10 MG tablet; Take 1 tablet (10 mg total) by mouth 3 (three) times daily as needed for muscle spasms.   There are no diagnoses linked to this encounter.  No orders of the defined types were placed in this encounter.   Follow-up: Return in about 2 months (around 12/10/2016) for RA and HTN .   Boykin Nearing MD

## 2016-10-10 NOTE — Progress Notes (Signed)
Pt is here today for wrist pain. Pt pain level is 8 in both wrist.

## 2016-10-10 NOTE — Patient Instructions (Addendum)
Mariah Martinez was seen today for wrist pain.  Diagnoses and all orders for this visit:  Elevated result in multi-biomarker disease activity panel for rheumatoid arthritis -     Ambulatory referral to Rheumatology -     predniSONE (DELTASONE) 10 MG tablet; Take by mouth with breakfast 40 mg daily for 3 days, then 20 mg daily for 3 days, then 10 mg daily  Essential hypertension -     hydrochlorothiazide (HYDRODIURIL) 25 MG tablet; Take 1 tablet (25 mg total) by mouth daily.  Abdominal wall pain in right upper quadrant -     cyclobenzaprine (FLEXERIL) 10 MG tablet; Take 1 tablet (10 mg total) by mouth 3 (three) times daily as needed for muscle spasms.  Muscle cramping -     cyclobenzaprine (FLEXERIL) 10 MG tablet; Take 1 tablet (10 mg total) by mouth 3 (three) times daily as needed for muscle spasms.   F/u in 2 months   Dr. Adrian Blackwater    Rheumatoid Arthritis Introduction Rheumatoid arthritis (RA) is a long-term (chronic) disease. RA causes inflammation in your joints. Your joints may feel painful, stiff, swollen, warm, or tender. RA may start slowly. Usually, it affects the small joints of the hands and feet. It can also affect other parts of the body, even the heart, eyes, or lungs. Symptoms of RA often come and go. Sometimes, symptoms get worse for a while. These are called flares. There is no cure for RA, but your doctor will work with you to find the best treatment option for you. This will depend on how the disease is changing in your body. Follow these instructions at home:  Take over-the-counter and prescription medicines only as told by your doctor. Your doctor may change (adjust) your medicines every 3 months.  Start an exercise program as told by your doctor.  Rest when you have a flare.  Return to your normal activities as told by your doctor. Ask your doctor what activities are safe for you.  Keep all follow-up visits as told by your doctor. This is important. Contact a doctor  if:  You have a flare.  You have a fever.  You have problems (side effects) because of your medicines. Get help right away if:  You have chest pain.  You have trouble breathing.  You have a hot, painful joint all of a sudden, and it is worse than your usual joint aches. This information is not intended to replace advice given to you by your health care provider. Make sure you discuss any questions you have with your health care provider. Document Released: 01/20/2012 Document Revised: 04/04/2016 Document Reviewed: 08/10/2015  2017 Elsevier

## 2016-10-11 MED FILL — ACETAMINOPHEN/COD #3 TABLET: 300-30 | 10 days supply | Qty: 60 | Fill #2

## 2016-10-11 NOTE — Assessment & Plan Note (Signed)
Well-controlled. Continue HCTZ

## 2016-10-11 NOTE — Assessment & Plan Note (Signed)
Suspect RA Referral placed to rheumatology Prednisone taper

## 2016-12-27 ENCOUNTER — Telehealth: Payer: Self-pay | Admitting: Family Medicine

## 2016-12-27 NOTE — Telephone Encounter (Signed)
Pamela Martinique from [facility] in [place] called requesting to speak with pt. PCP. She states that the hospital has the body of the pt. And would like to speak with her.   Pamela Martinique: Work Cell Phone [phone]      Social Work [phone]  Please f/u

## 2016-12-27 NOTE — Telephone Encounter (Signed)
Called back to Mariah Martinez Patient is alive but in a coma at [facility] Room 1508 stepdown MRN [id number] Attending for February is Dr. [place] Sink  Phone # [phone]  She came on vacation to visit her cousin, [name].  Admitted with tachycardia, MRSA, sacral ulcer, DKA,  She was in CCU 11/19/16-11/20/16 ICU from 11/20/16-12/04/16   Stepdown from 1/24-1/29 Infection, back to ICU from 1/29-2/4 2/4-now in stepdown unit    She is on trach In coma  Non verbal   Need insurance info and SS #, son does not know this info to get long term care placement   Patient is uninsured Provided SS #

## 2017-03-11 DEATH — deceased

## 2017-03-26 ENCOUNTER — Encounter: Payer: Self-pay | Admitting: Family Medicine

## 2018-03-24 IMAGING — CR DG WRIST COMPLETE 3+V*R*
4 series · 4 of 4 positions shown · non-contrast
Comparison: None.

CLINICAL DATA: Right wrist and thumb pain, no injury

EXAM:
RIGHT WRIST - COMPLETE 3+ VIEW

[x wrist pa right]
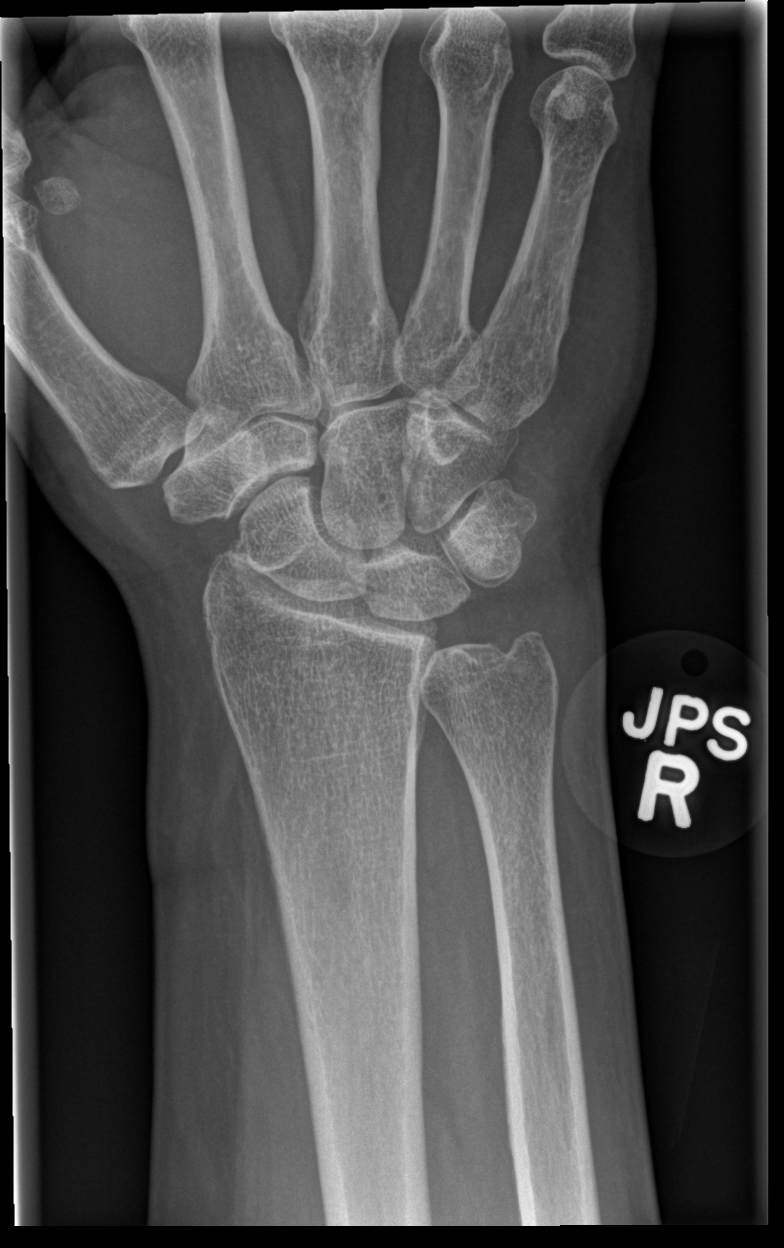

[x wrist obl right]
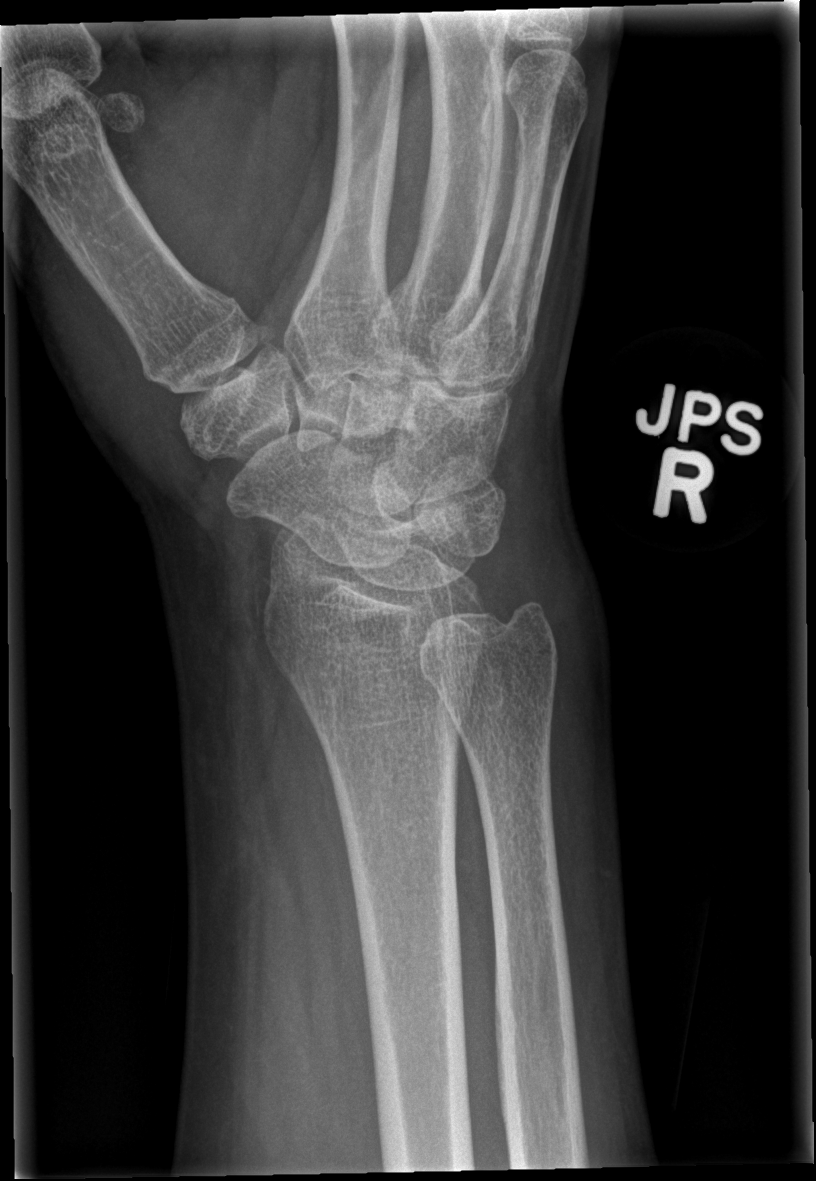

[x wrist lat right]
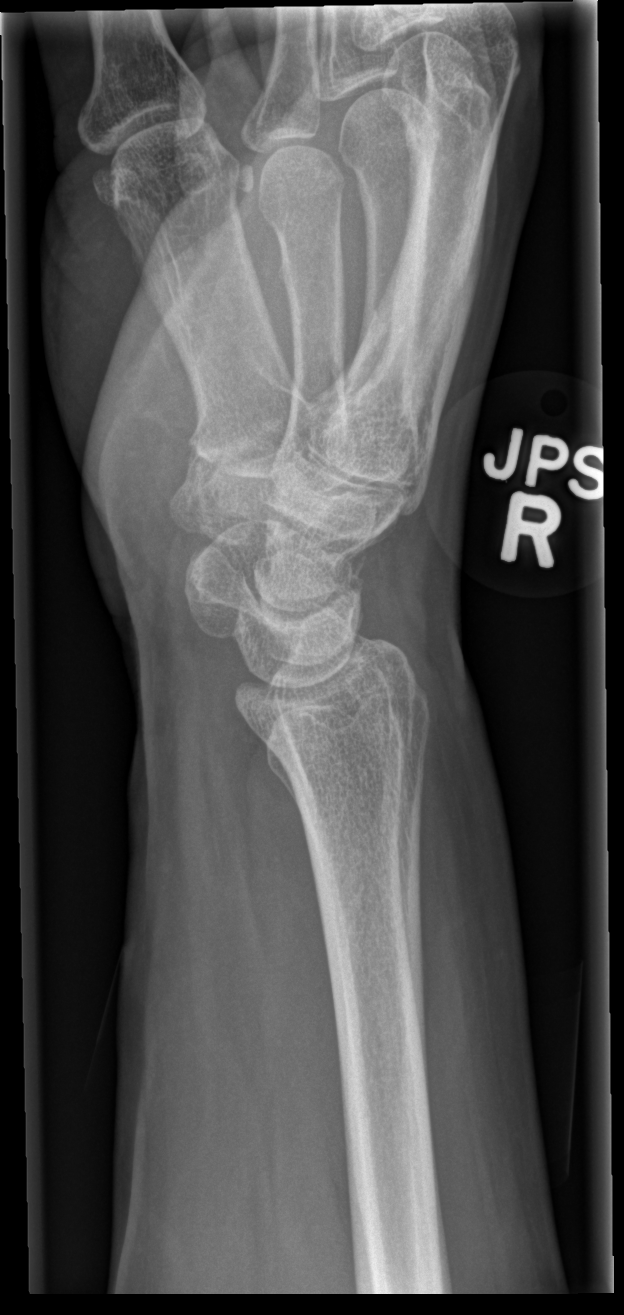

[x wrist navicular view right]
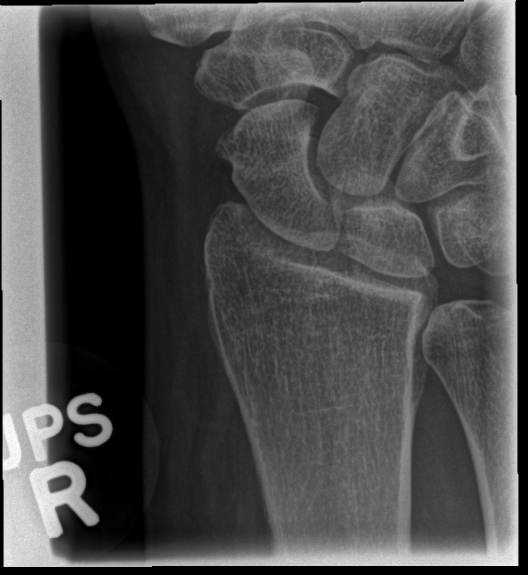

[4 of 4 positions shown; findings below may reference images not displayed]

FINDINGS: The right radiocarpal joint space appears normal, and the ulnar
styloid is intact. Carpal bones are in normal position. Alignment is
normal.
IMPRESSION: Negative.

## 2018-03-24 IMAGING — CR DG WRIST COMPLETE 3+V*L*
4 series · 4 of 4 positions shown · non-contrast
Comparison: None.

CLINICAL DATA: Fell on outstretched hand 2 months ago with
persistent pain

EXAM:
LEFT WRIST - COMPLETE 3+ VIEW

[x wrist pa left]
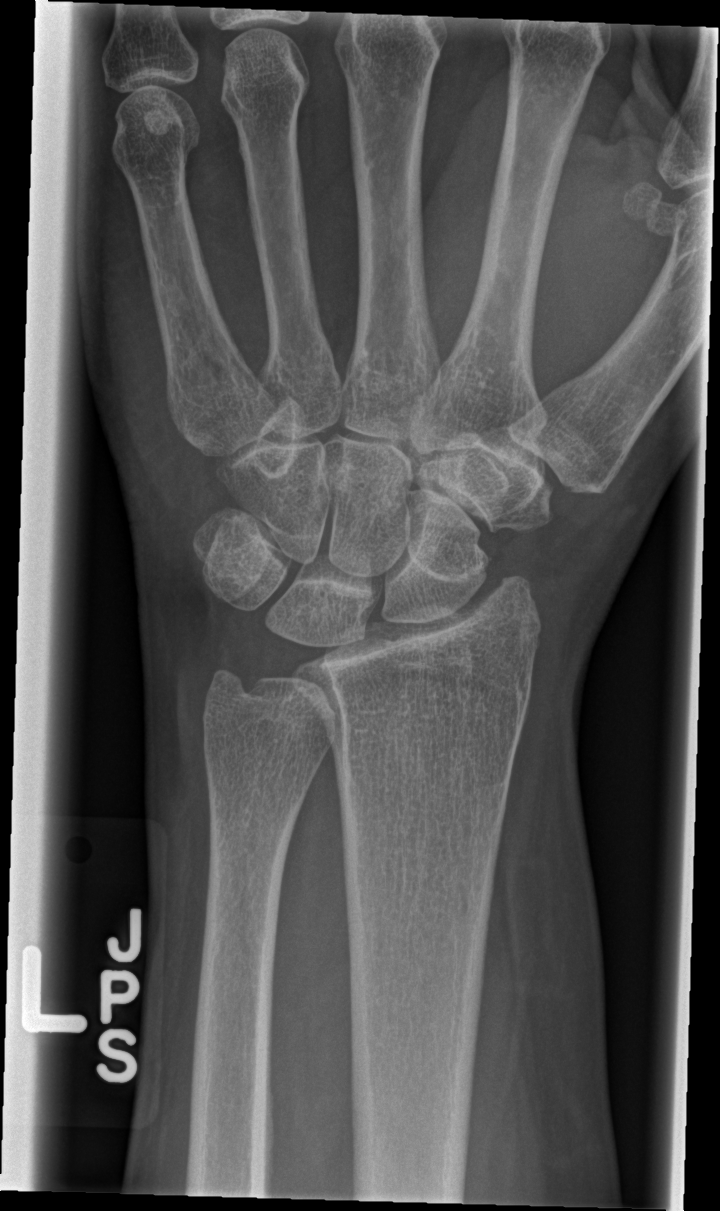

[x wrist obl left]
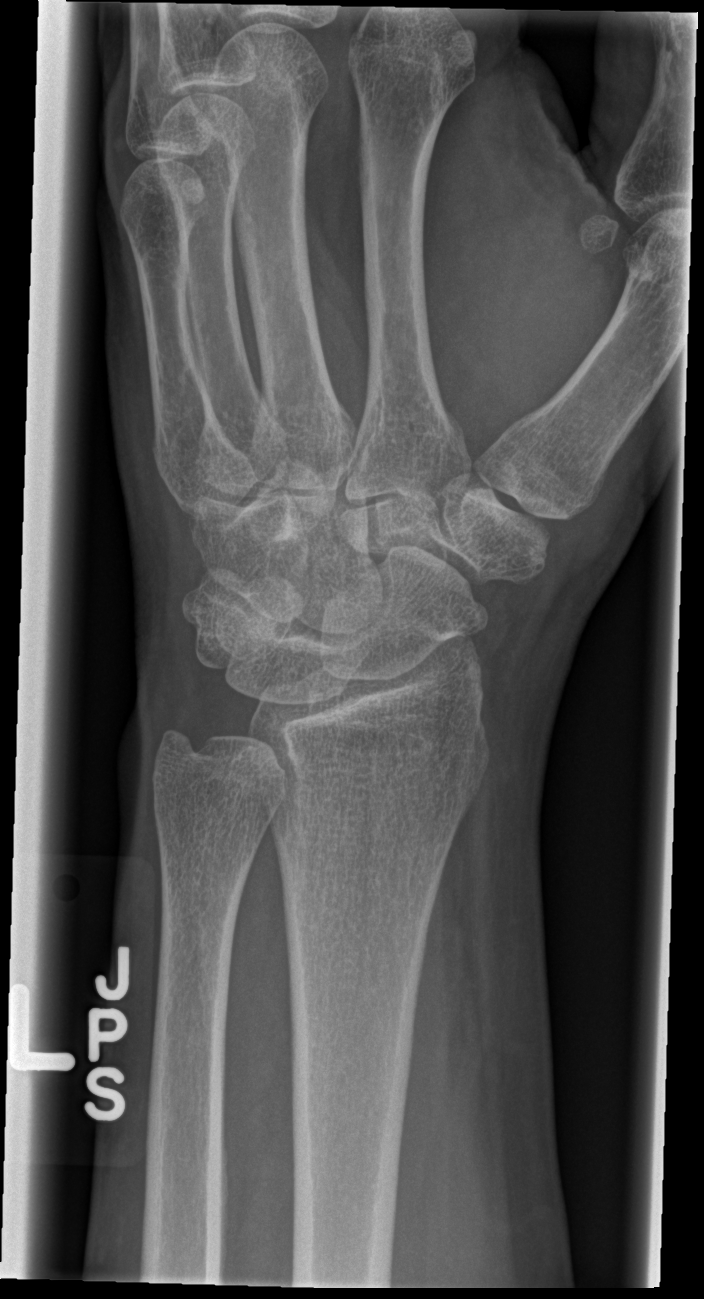

[x wrist lat left]
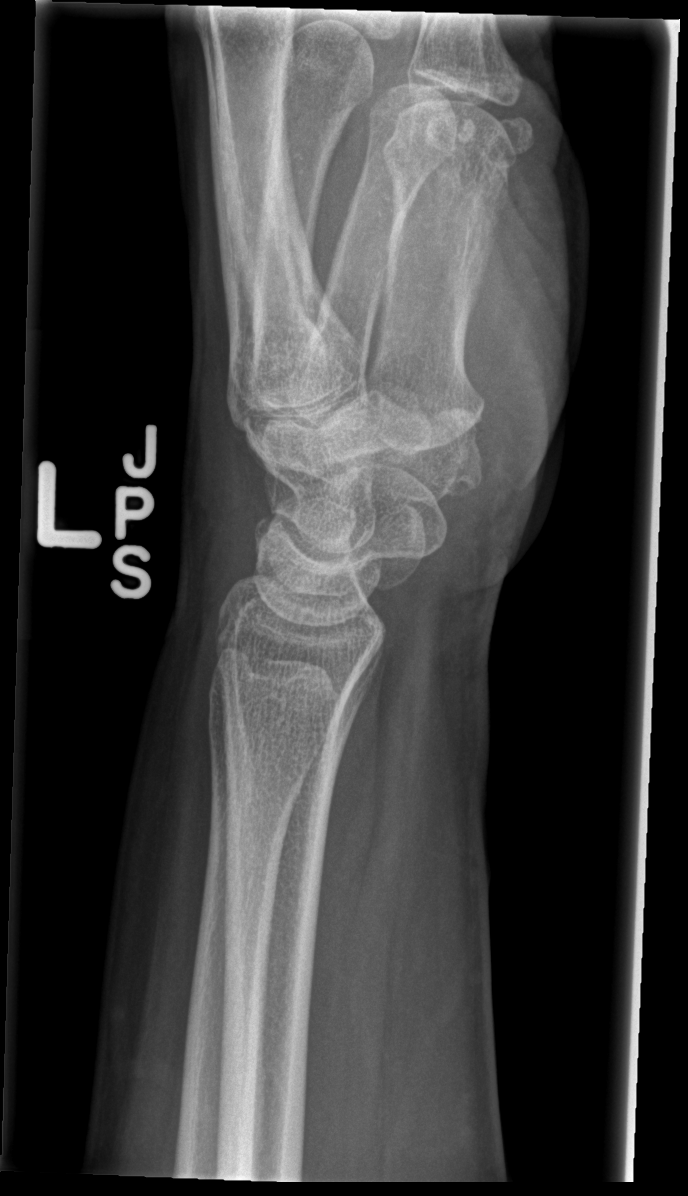

[x wrist navicular view left]
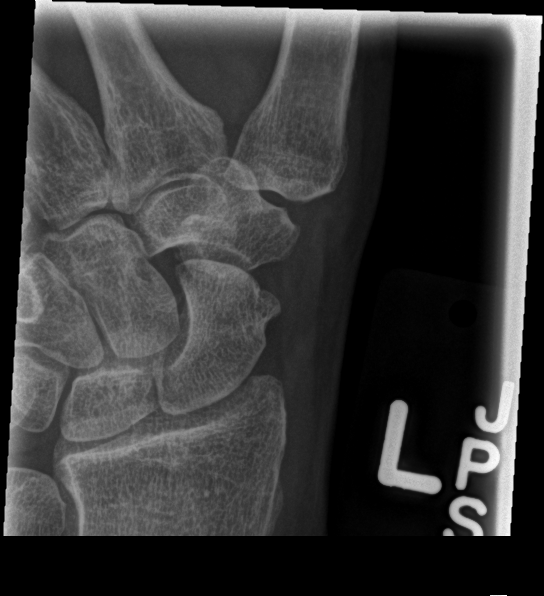

[4 of 4 positions shown; findings below may reference images not displayed]

FINDINGS: The left radiocarpal joint space appears normal. The ulnar styloid
is intact. The carpal bones are normal position. Alignment is
normal.
IMPRESSION: Negative.

## 2021-08-21 ENCOUNTER — Encounter: Payer: Self-pay | Admitting: Gastroenterology
